# Patient Record
Sex: Female | Born: 1935 | Race: White | Hispanic: No | Marital: Single | Smoking: Former smoker
Health system: Southern US, Community
[De-identification: ages and names within clinical notes are randomized; demographics above are authoritative.]

## PROBLEM LIST (undated history)

## (undated) DIAGNOSIS — K5790 Diverticulosis of intestine, part unspecified, without perforation or abscess without bleeding: Secondary | ICD-10-CM

## (undated) DIAGNOSIS — R1310 Dysphagia, unspecified: Secondary | ICD-10-CM

## (undated) DIAGNOSIS — C801 Malignant (primary) neoplasm, unspecified: Secondary | ICD-10-CM

## (undated) DIAGNOSIS — H409 Unspecified glaucoma: Secondary | ICD-10-CM

## (undated) DIAGNOSIS — J45909 Unspecified asthma, uncomplicated: Secondary | ICD-10-CM

## (undated) DIAGNOSIS — D499 Neoplasm of unspecified behavior of unspecified site: Secondary | ICD-10-CM

## (undated) DIAGNOSIS — F0280 Dementia in other diseases classified elsewhere without behavioral disturbance: Secondary | ICD-10-CM

## (undated) DIAGNOSIS — G4700 Insomnia, unspecified: Secondary | ICD-10-CM

## (undated) DIAGNOSIS — F32A Depression, unspecified: Secondary | ICD-10-CM

## (undated) DIAGNOSIS — J449 Chronic obstructive pulmonary disease, unspecified: Secondary | ICD-10-CM

## (undated) DIAGNOSIS — H5440 Blindness, one eye, unspecified eye: Secondary | ICD-10-CM

## (undated) DIAGNOSIS — F329 Major depressive disorder, single episode, unspecified: Secondary | ICD-10-CM

## (undated) DIAGNOSIS — E559 Vitamin D deficiency, unspecified: Secondary | ICD-10-CM

## (undated) DIAGNOSIS — G309 Alzheimer's disease, unspecified: Secondary | ICD-10-CM

## (undated) DIAGNOSIS — E785 Hyperlipidemia, unspecified: Secondary | ICD-10-CM

## (undated) DIAGNOSIS — I10 Essential (primary) hypertension: Secondary | ICD-10-CM

## (undated) HISTORY — PX: ABDOMINAL HYSTERECTOMY: SHX81

## (undated) HISTORY — PX: CATARACT EXTRACTION: SUR2

## (undated) HISTORY — PX: CHOLECYSTECTOMY: SHX55

## (undated) HISTORY — PX: TOTAL HIP ARTHROPLASTY: SHX124

---

## 1958-12-23 HISTORY — PX: LUNG REMOVAL, PARTIAL: SHX233

## 2019-02-18 ENCOUNTER — Inpatient Hospital Stay (HOSPITAL_COMMUNITY)
Admission: EM | Admit: 2019-02-18 | Discharge: 2019-03-24 | DRG: 190 | Disposition: E | Payer: Medicare HMO | Attending: Internal Medicine | Admitting: Internal Medicine

## 2019-02-18 ENCOUNTER — Emergency Department (HOSPITAL_COMMUNITY): Payer: Medicare HMO

## 2019-02-18 ENCOUNTER — Encounter (HOSPITAL_COMMUNITY): Payer: Self-pay

## 2019-02-18 ENCOUNTER — Other Ambulatory Visit: Payer: Self-pay

## 2019-02-18 DIAGNOSIS — Z681 Body mass index (BMI) 19 or less, adult: Secondary | ICD-10-CM | POA: Diagnosis not present

## 2019-02-18 DIAGNOSIS — E559 Vitamin D deficiency, unspecified: Secondary | ICD-10-CM | POA: Diagnosis present

## 2019-02-18 DIAGNOSIS — G309 Alzheimer's disease, unspecified: Secondary | ICD-10-CM | POA: Diagnosis present

## 2019-02-18 DIAGNOSIS — Z515 Encounter for palliative care: Secondary | ICD-10-CM | POA: Diagnosis present

## 2019-02-18 DIAGNOSIS — Z880 Allergy status to penicillin: Secondary | ICD-10-CM

## 2019-02-18 DIAGNOSIS — R0602 Shortness of breath: Secondary | ICD-10-CM

## 2019-02-18 DIAGNOSIS — T17908A Unspecified foreign body in respiratory tract, part unspecified causing other injury, initial encounter: Secondary | ICD-10-CM | POA: Diagnosis present

## 2019-02-18 DIAGNOSIS — Z882 Allergy status to sulfonamides status: Secondary | ICD-10-CM

## 2019-02-18 DIAGNOSIS — E785 Hyperlipidemia, unspecified: Secondary | ICD-10-CM | POA: Diagnosis present

## 2019-02-18 DIAGNOSIS — I248 Other forms of acute ischemic heart disease: Secondary | ICD-10-CM | POA: Diagnosis present

## 2019-02-18 DIAGNOSIS — E876 Hypokalemia: Secondary | ICD-10-CM | POA: Diagnosis present

## 2019-02-18 DIAGNOSIS — I10 Essential (primary) hypertension: Secondary | ICD-10-CM | POA: Diagnosis present

## 2019-02-18 DIAGNOSIS — D320 Benign neoplasm of cerebral meninges: Secondary | ICD-10-CM | POA: Diagnosis present

## 2019-02-18 DIAGNOSIS — R1314 Dysphagia, pharyngoesophageal phase: Secondary | ICD-10-CM | POA: Diagnosis not present

## 2019-02-18 DIAGNOSIS — Z8249 Family history of ischemic heart disease and other diseases of the circulatory system: Secondary | ICD-10-CM

## 2019-02-18 DIAGNOSIS — Z96642 Presence of left artificial hip joint: Secondary | ICD-10-CM | POA: Diagnosis present

## 2019-02-18 DIAGNOSIS — J690 Pneumonitis due to inhalation of food and vomit: Secondary | ICD-10-CM | POA: Diagnosis present

## 2019-02-18 DIAGNOSIS — Z85828 Personal history of other malignant neoplasm of skin: Secondary | ICD-10-CM

## 2019-02-18 DIAGNOSIS — J9602 Acute respiratory failure with hypercapnia: Secondary | ICD-10-CM | POA: Diagnosis present

## 2019-02-18 DIAGNOSIS — E872 Acidosis: Secondary | ICD-10-CM | POA: Diagnosis not present

## 2019-02-18 DIAGNOSIS — Z7189 Other specified counseling: Secondary | ICD-10-CM

## 2019-02-18 DIAGNOSIS — Z66 Do not resuscitate: Secondary | ICD-10-CM | POA: Diagnosis present

## 2019-02-18 DIAGNOSIS — R0600 Dyspnea, unspecified: Secondary | ICD-10-CM | POA: Diagnosis not present

## 2019-02-18 DIAGNOSIS — Z888 Allergy status to other drugs, medicaments and biological substances status: Secondary | ICD-10-CM

## 2019-02-18 DIAGNOSIS — R7989 Other specified abnormal findings of blood chemistry: Secondary | ICD-10-CM | POA: Diagnosis not present

## 2019-02-18 DIAGNOSIS — J441 Chronic obstructive pulmonary disease with (acute) exacerbation: Secondary | ICD-10-CM | POA: Diagnosis present

## 2019-02-18 DIAGNOSIS — I119 Hypertensive heart disease without heart failure: Secondary | ICD-10-CM | POA: Diagnosis present

## 2019-02-18 DIAGNOSIS — J811 Chronic pulmonary edema: Secondary | ICD-10-CM

## 2019-02-18 DIAGNOSIS — Z87891 Personal history of nicotine dependence: Secondary | ICD-10-CM

## 2019-02-18 DIAGNOSIS — R1310 Dysphagia, unspecified: Secondary | ICD-10-CM | POA: Diagnosis present

## 2019-02-18 DIAGNOSIS — J810 Acute pulmonary edema: Secondary | ICD-10-CM | POA: Diagnosis not present

## 2019-02-18 DIAGNOSIS — I361 Nonrheumatic tricuspid (valve) insufficiency: Secondary | ICD-10-CM | POA: Diagnosis not present

## 2019-02-18 DIAGNOSIS — H409 Unspecified glaucoma: Secondary | ICD-10-CM | POA: Diagnosis present

## 2019-02-18 DIAGNOSIS — F0280 Dementia in other diseases classified elsewhere without behavioral disturbance: Secondary | ICD-10-CM | POA: Diagnosis present

## 2019-02-18 DIAGNOSIS — H5462 Unqualified visual loss, left eye, normal vision right eye: Secondary | ICD-10-CM | POA: Diagnosis present

## 2019-02-18 DIAGNOSIS — E43 Unspecified severe protein-calorie malnutrition: Secondary | ICD-10-CM | POA: Diagnosis present

## 2019-02-18 DIAGNOSIS — R778 Other specified abnormalities of plasma proteins: Secondary | ICD-10-CM | POA: Diagnosis present

## 2019-02-18 DIAGNOSIS — T17908D Unspecified foreign body in respiratory tract, part unspecified causing other injury, subsequent encounter: Secondary | ICD-10-CM | POA: Diagnosis not present

## 2019-02-18 DIAGNOSIS — R4702 Dysphasia: Secondary | ICD-10-CM | POA: Diagnosis present

## 2019-02-18 DIAGNOSIS — F329 Major depressive disorder, single episode, unspecified: Secondary | ICD-10-CM | POA: Diagnosis present

## 2019-02-18 DIAGNOSIS — G4700 Insomnia, unspecified: Secondary | ICD-10-CM | POA: Diagnosis present

## 2019-02-18 DIAGNOSIS — J9601 Acute respiratory failure with hypoxia: Secondary | ICD-10-CM | POA: Diagnosis present

## 2019-02-18 DIAGNOSIS — R0989 Other specified symptoms and signs involving the circulatory and respiratory systems: Secondary | ICD-10-CM | POA: Diagnosis not present

## 2019-02-18 DIAGNOSIS — I340 Nonrheumatic mitral (valve) insufficiency: Secondary | ICD-10-CM | POA: Diagnosis not present

## 2019-02-18 DIAGNOSIS — Z79899 Other long term (current) drug therapy: Secondary | ICD-10-CM

## 2019-02-18 HISTORY — DX: Essential (primary) hypertension: I10

## 2019-02-18 HISTORY — DX: Vitamin D deficiency, unspecified: E55.9

## 2019-02-18 HISTORY — DX: Insomnia, unspecified: G47.00

## 2019-02-18 HISTORY — DX: Diverticulosis of intestine, part unspecified, without perforation or abscess without bleeding: K57.90

## 2019-02-18 HISTORY — DX: Unspecified asthma, uncomplicated: J45.909

## 2019-02-18 HISTORY — DX: Blindness, one eye, unspecified eye: H54.40

## 2019-02-18 HISTORY — DX: Unspecified glaucoma: H40.9

## 2019-02-18 HISTORY — DX: Depression, unspecified: F32.A

## 2019-02-18 HISTORY — DX: Chronic obstructive pulmonary disease, unspecified: J44.9

## 2019-02-18 HISTORY — DX: Malignant (primary) neoplasm, unspecified: C80.1

## 2019-02-18 HISTORY — DX: Major depressive disorder, single episode, unspecified: F32.9

## 2019-02-18 HISTORY — DX: Alzheimer's disease, unspecified: G30.9

## 2019-02-18 HISTORY — DX: Neoplasm of unspecified behavior of unspecified site: D49.9

## 2019-02-18 HISTORY — DX: Dementia in other diseases classified elsewhere, unspecified severity, without behavioral disturbance, psychotic disturbance, mood disturbance, and anxiety: F02.80

## 2019-02-18 HISTORY — DX: Hyperlipidemia, unspecified: E78.5

## 2019-02-18 HISTORY — DX: Dysphagia, unspecified: R13.10

## 2019-02-18 LAB — CBC WITH DIFFERENTIAL/PLATELET
Abs Immature Granulocytes: 0.03 10*3/uL (ref 0.00–0.07)
Basophils Absolute: 0 10*3/uL (ref 0.0–0.1)
Basophils Relative: 0 %
Eosinophils Absolute: 0.2 10*3/uL (ref 0.0–0.5)
Eosinophils Relative: 2 %
HCT: 54 % — ABNORMAL HIGH (ref 36.0–46.0)
Hemoglobin: 16.3 g/dL — ABNORMAL HIGH (ref 12.0–15.0)
IMMATURE GRANULOCYTES: 0 %
Lymphocytes Relative: 22 %
Lymphs Abs: 2.3 10*3/uL (ref 0.7–4.0)
MCH: 28 pg (ref 26.0–34.0)
MCHC: 30.2 g/dL (ref 30.0–36.0)
MCV: 92.6 fL (ref 80.0–100.0)
MONOS PCT: 5 %
Monocytes Absolute: 0.5 10*3/uL (ref 0.1–1.0)
Neutro Abs: 7.5 10*3/uL (ref 1.7–7.7)
Neutrophils Relative %: 71 %
Platelets: 249 10*3/uL (ref 150–400)
RBC: 5.83 MIL/uL — ABNORMAL HIGH (ref 3.87–5.11)
RDW: 12.5 % (ref 11.5–15.5)
WBC: 10.6 10*3/uL — ABNORMAL HIGH (ref 4.0–10.5)
nRBC: 0 % (ref 0.0–0.2)

## 2019-02-18 LAB — COMPREHENSIVE METABOLIC PANEL
ALT: 14 U/L (ref 0–44)
AST: 24 U/L (ref 15–41)
Albumin: 4.1 g/dL (ref 3.5–5.0)
Alkaline Phosphatase: 71 U/L (ref 38–126)
Anion gap: 11 (ref 5–15)
BUN: 7 mg/dL — AB (ref 8–23)
CO2: 31 mmol/L (ref 22–32)
Calcium: 8.8 mg/dL — ABNORMAL LOW (ref 8.9–10.3)
Chloride: 94 mmol/L — ABNORMAL LOW (ref 98–111)
Creatinine, Ser: 0.65 mg/dL (ref 0.44–1.00)
GFR calc Af Amer: >60 mL/min (ref >60)
GFR calc non Af Amer: >60 mL/min (ref >60)
Glucose, Bld: 186 mg/dL — ABNORMAL HIGH (ref 70–99)
Potassium: 2.6 mmol/L — CL (ref 3.5–5.1)
Sodium: 136 mmol/L (ref 135–145)
Total Bilirubin: 0.4 mg/dL (ref 0.3–1.2)
Total Protein: 7.4 g/dL (ref 6.5–8.1)

## 2019-02-18 LAB — TROPONIN I: Troponin I: 0.09 ng/mL (ref <0.03)

## 2019-02-18 MED ORDER — POTASSIUM CHLORIDE 10 MEQ/100ML IV SOLN
10.0000 meq | Freq: Once | INTRAVENOUS | Status: AC
  Administered 2019-02-19: 10 meq via INTRAVENOUS
  Filled 2019-02-18: qty 100

## 2019-02-18 MED ORDER — IPRATROPIUM-ALBUTEROL 0.5-2.5 (3) MG/3ML IN SOLN
3.0000 mL | Freq: Once | RESPIRATORY_TRACT | Status: AC
  Administered 2019-02-18: 3 mL via RESPIRATORY_TRACT
  Filled 2019-02-18: qty 3

## 2019-02-18 MED ORDER — IPRATROPIUM-ALBUTEROL 0.5-2.5 (3) MG/3ML IN SOLN: 3.0000 mL | Freq: Once | RESPIRATORY_TRACT | Status: DC

## 2019-02-18 MED ORDER — METHYLPREDNISOLONE SODIUM SUCC 125 MG IJ SOLR
125.0000 mg | Freq: Once | INTRAMUSCULAR | Status: AC
  Administered 2019-02-18: 125 mg via INTRAVENOUS
  Filled 2019-02-18: qty 2

## 2019-02-18 MED ORDER — ALBUTEROL SULFATE (2.5 MG/3ML) 0.083% IN NEBU
2.5000 mg | INHALATION_SOLUTION | Freq: Once | RESPIRATORY_TRACT | Status: AC
  Administered 2019-02-18: 2.5 mg via RESPIRATORY_TRACT
  Filled 2019-02-18: qty 3

## 2019-02-18 MED ORDER — POTASSIUM CHLORIDE 10 MEQ/100ML IV SOLN
10.0000 meq | Freq: Once | INTRAVENOUS | Status: AC
  Administered 2019-02-18: 10 meq via INTRAVENOUS
  Filled 2019-02-18: qty 100

## 2019-02-18 MED ORDER — MAGNESIUM SULFATE 2 GM/50ML IV SOLN
2.0000 g | Freq: Once | INTRAVENOUS | Status: AC
  Administered 2019-02-18: 2 g via INTRAVENOUS
  Filled 2019-02-18: qty 50

## 2019-02-18 NOTE — ED Notes (Signed)
Date and time results received: 02/04/2019 2320 (use smartphrase ".now" to insert current time)  Test: potassium Critical Value: 2.6  Name of Provider Notified: Dr. Roderic Palau  Orders Received? Or Actions Taken?: Actions Taken: no orders received

## 2019-02-18 NOTE — ED Provider Notes (Signed)
[facility] EMERGENCY DEPARTMENT Provider Note   CSN: [id number] Arrival date & time: 02/07/2019  2125    History   Chief Complaint Chief Complaint  Patient presents with  . Respiratory Distress    HPI Sheila Flores is a 85 y.o. female.     Patient has a history of COPD.  Patient was eating spaghetti and got choked and became short of breath.   Shortness of Breath  Severity:  Severe Onset quality:  Sudden Timing:  Constant Progression:  Resolved Chronicity:  New Context: not activity   Relieved by:  Nothing Worsened by:  Nothing Ineffective treatments:  None tried Associated symptoms: no abdominal pain, no chest pain, no cough, no headaches and no rash     Past Medical History:  Diagnosis Date  . Alzheimer's dementia ([place])   . Asthma   . Blindness of left eye   . Cancer ([place])    basal cell carncinoma  . COPD (chronic obstructive pulmonary disease) ([place])   . Depression   . Diverticulosis   . Dysphagia   . Glaucoma   . Hyperlipidemia   . Hypertension   . Insomnia   . Vitamin D deficiency     Patient Active Problem List   Diagnosis Date Noted  . COPD exacerbation ([place]) 01/30/2019    Past Surgical History:  Procedure Laterality Date  . ABDOMINAL HYSTERECTOMY    . CATARACT EXTRACTION    . CHOLECYSTECTOMY    . LUNG REMOVAL, PARTIAL Left 1960  . TOTAL HIP ARTHROPLASTY Left      OB History   No obstetric history on file.      Home Medications    Prior to Admission medications   Medication Sig Start Date End Date Taking? Authorizing Provider  albuterol (PROVENTIL) (2.5 MG/3ML) 0.083% nebulizer solution Take 1 ampule by nebulization 3 (three) times daily as needed for wheezing or shortness of breath.  09/10/18  Yes [provider]  amLODipine (NORVASC) 2.5 MG tablet Take 2.5 mg by mouth every evening.  03/25/18  Yes [provider]  atorvastatin (LIPITOR) 10 MG tablet Take 10 mg by mouth every evening.  03/25/18  Yes [provider]  montelukast (SINGULAIR) 10 MG tablet Take 10 mg by mouth every evening.  02/08/19  Yes [provider]  sertraline (ZOLOFT) 25 MG tablet Take 25 mg by mouth every evening.  12/25/18  Yes [provider]    Family History Family History  Problem Relation Age of Onset  . Cancer Mother   . Heart failure Mother   . Heart failure Father     Social History Social History   Tobacco Use  . Smoking status: Former Research scientist (life sciences)  . Smokeless tobacco: Never Used  Substance Use Topics  . Alcohol use: Never    Frequency: Never  . Drug use: Never     Allergies   Cephalexin; Clarithromycin; Penicillins; and Sulfa antibiotics   Review of Systems Review of Systems  Constitutional: Negative for appetite change and fatigue.  HENT: Negative for congestion, ear discharge and sinus pressure.   Eyes: Negative for discharge.  Respiratory: Positive for shortness of breath. Negative for cough.   Cardiovascular: Negative for chest pain.  Gastrointestinal: Negative for abdominal pain and diarrhea.  Genitourinary: Negative for frequency and hematuria.  Musculoskeletal: Negative for back pain.  Skin: Negative for rash.  Neurological: Negative for seizures and headaches.  Psychiatric/Behavioral: Negative for hallucinations.     Physical Exam Updated Vital Signs Ht 5\' 2"  (1.575 m)  Wt 55 kg   LMP  (Exact Date)   SpO2 94%   BMI 22.18 kg/m   Physical Exam Vitals signs and nursing note reviewed.  Constitutional:      Appearance: She is well-developed.  HENT:     Head: Normocephalic.     Nose: Nose normal.  Eyes:     General: No scleral icterus.    Conjunctiva/sclera: Conjunctivae normal.  Neck:     Musculoskeletal: Neck supple.     Thyroid: No thyromegaly.  Cardiovascular:     Rate and Rhythm: Normal rate and regular rhythm.     Heart sounds: No murmur. No friction rub. No gallop.   Pulmonary:     Breath sounds: No stridor. Wheezing present. No rales.    Chest:     Chest wall: No tenderness.  Abdominal:     General: There is no distension.     Tenderness: There is no abdominal tenderness. There is no rebound.  Musculoskeletal: Normal range of motion.  Lymphadenopathy:     Cervical: No cervical adenopathy.  Skin:    Findings: No erythema or rash.  Neurological:     Mental Status: She is oriented to person, place, and time.     Motor: No abnormal muscle tone.     Coordination: Coordination normal.  Psychiatric:        Behavior: Behavior normal.      ED Treatments / Results  Labs (all labs ordered are listed, but only abnormal results are displayed) Labs Reviewed  CBC WITH DIFFERENTIAL/PLATELET - Abnormal; Notable for the following components:      Result Value   WBC 10.6 (*)    RBC 5.83 (*)    Hemoglobin 16.3 (*)    HCT 54.0 (*)    All other components within normal limits  COMPREHENSIVE METABOLIC PANEL - Abnormal; Notable for the following components:   Potassium 2.6 (*)    Chloride 94 (*)    Glucose, Bld 186 (*)    BUN 7 (*)    Calcium 8.8 (*)    All other components within normal limits  TROPONIN I - Abnormal; Notable for the following components:   Troponin I 0.09 (*)    All other components within normal limits    EKG EKG Interpretation  Date/Time:  Thursday February 18 2019 21:30:49 EST Ventricular Rate:  120 PR Interval:    QRS Duration: 94 QT Interval:  327 QTC Calculation: 461 R Axis:   77 Text Interpretation:  Sinus tachycardia Probable anteroseptal infarct, old Minimal ST depression Confirmed by Milton Ferguson [phone]) on 02/17/2019 10:39:45 PM Also confirmed by Milton Ferguson [phone])  on 02/13/2019 11:23:13 PM   Radiology Dg Chest Portable 1 View  Result Date: 01/29/2019 CLINICAL DATA:  Shortness of breath EXAM: PORTABLE CHEST 1 VIEW COMPARISON:  None. FINDINGS: There is hyperinflation of the lungs compatible with COPD. Mild cardiomegaly. No confluent airspace opacities, effusions or edema. No acute  bony abnormality. IMPRESSION: COPD, cardiomegaly.  No active disease. Electronically Signed   By: Rolm Baptise M.D.   On: 01/26/2019 21:56    Procedures Procedures (including critical care time)  Medications Ordered in ED Medications  ipratropium-albuterol (DUONEB) 0.5-2.5 (3) MG/3ML nebulizer solution 3 mL (has no administration in time range)  potassium chloride 10 mEq in 100 mL IVPB (has no administration in time range)  ipratropium-albuterol (DUONEB) 0.5-2.5 (3) MG/3ML nebulizer solution 3 mL (3 mLs Nebulization Given 02/02/2019 2146)  albuterol (PROVENTIL) (2.5 MG/3ML) 0.083% nebulizer solution 2.5 mg (2.5 mg  Nebulization Given 01/29/2019 2146)  magnesium sulfate IVPB 2 g 50 mL (0 g Intravenous Stopped 02/16/2019 2301)  methylPREDNISolone sodium succinate (SOLU-MEDROL) 125 mg/2 mL injection 125 mg (125 mg Intravenous Given 02/07/2019 2148)     Initial Impression / Assessment and Plan / ED Course  I have reviewed the triage vital signs and the nursing notes.  Pertinent labs & imaging results that were available during my care of the patient were reviewed by me and considered in my medical decision making (see chart for details).    CRITICAL CARE Performed by: Milton Ferguson Total critical care time:40 minutes Critical care time was exclusive of separately billable procedures and treating other patients. Critical care was necessary to treat or prevent imminent or life-threatening deterioration. Critical care was time spent personally by me on the following activities: development of treatment plan with patient and/or surrogate as well as nursing, discussions with consultants, evaluation of patient's response to treatment, examination of patient, obtaining history from patient or surrogate, ordering and performing treatments and interventions, ordering and review of laboratory studies, ordering and review of radiographic studies, pulse oximetry and re-evaluation of patient's condition.    Patient  with choking episode and wheezing.  She has received neb treatments as helped some but she remains slightly hypoxic and needs 2 L of oxygen.  Patient may have aspirated.  Patient is also hypokalemic and has elevated troponin.  She will be admitted to medicine   Final Clinical Impressions(s) / ED Diagnoses   Final diagnoses:  COPD exacerbation [facility])    ED Discharge Orders    None       Milton Ferguson, MD 02/04/2019 2338

## 2019-02-18 NOTE — ED Triage Notes (Signed)
Pt in by [facility] after choking after eating spaghetti.  Per ems, pt had cyanosis to her lips, sats in the 70's, placed on 15 liters nrb per ems with some improvement.

## 2019-02-18 NOTE — ED Notes (Signed)
X-Ray at bedside.

## 2019-02-18 NOTE — ED Notes (Signed)
Date and time results received: 01/24/2019 2320 (use smartphrase ".now" to insert current time)  Test: troponin Critical Value: 0.09  Name of Provider Notified: Dr Roderic Palau  Orders Received? Or Actions Taken?: Actions Taken: no orders received

## 2019-02-19 ENCOUNTER — Inpatient Hospital Stay (HOSPITAL_COMMUNITY): Payer: Medicare HMO

## 2019-02-19 ENCOUNTER — Other Ambulatory Visit: Payer: Self-pay

## 2019-02-19 ENCOUNTER — Encounter (HOSPITAL_COMMUNITY): Payer: Self-pay | Admitting: *Deleted

## 2019-02-19 DIAGNOSIS — I361 Nonrheumatic tricuspid (valve) insufficiency: Secondary | ICD-10-CM

## 2019-02-19 DIAGNOSIS — T17908A Unspecified foreign body in respiratory tract, part unspecified causing other injury, initial encounter: Secondary | ICD-10-CM | POA: Diagnosis present

## 2019-02-19 DIAGNOSIS — I248 Other forms of acute ischemic heart disease: Secondary | ICD-10-CM

## 2019-02-19 DIAGNOSIS — I10 Essential (primary) hypertension: Secondary | ICD-10-CM

## 2019-02-19 DIAGNOSIS — E876 Hypokalemia: Secondary | ICD-10-CM

## 2019-02-19 DIAGNOSIS — I340 Nonrheumatic mitral (valve) insufficiency: Secondary | ICD-10-CM

## 2019-02-19 DIAGNOSIS — J441 Chronic obstructive pulmonary disease with (acute) exacerbation: Principal | ICD-10-CM

## 2019-02-19 DIAGNOSIS — R7989 Other specified abnormal findings of blood chemistry: Secondary | ICD-10-CM

## 2019-02-19 DIAGNOSIS — R778 Other specified abnormalities of plasma proteins: Secondary | ICD-10-CM | POA: Diagnosis present

## 2019-02-19 DIAGNOSIS — F0280 Dementia in other diseases classified elsewhere without behavioral disturbance: Secondary | ICD-10-CM

## 2019-02-19 DIAGNOSIS — R1310 Dysphagia, unspecified: Secondary | ICD-10-CM

## 2019-02-19 DIAGNOSIS — R0989 Other specified symptoms and signs involving the circulatory and respiratory systems: Secondary | ICD-10-CM

## 2019-02-19 DIAGNOSIS — G309 Alzheimer's disease, unspecified: Secondary | ICD-10-CM

## 2019-02-19 LAB — CBC
HCT: 48.5 % — ABNORMAL HIGH (ref 36.0–46.0)
Hemoglobin: 14.9 g/dL (ref 12.0–15.0)
MCH: 28.5 pg (ref 26.0–34.0)
MCHC: 30.7 g/dL (ref 30.0–36.0)
MCV: 92.7 fL (ref 80.0–100.0)
Platelets: 239 10*3/uL (ref 150–400)
RBC: 5.23 MIL/uL — ABNORMAL HIGH (ref 3.87–5.11)
RDW: 12.5 % (ref 11.5–15.5)
WBC: 15.5 10*3/uL — ABNORMAL HIGH (ref 4.0–10.5)
nRBC: 0 % (ref 0.0–0.2)

## 2019-02-19 LAB — ECHOCARDIOGRAM COMPLETE
Height: 62 in
Weight: 1940.05 oz

## 2019-02-19 LAB — BASIC METABOLIC PANEL
Anion gap: 11 (ref 5–15)
BUN: 9 mg/dL (ref 8–23)
CALCIUM: 8.3 mg/dL — AB (ref 8.9–10.3)
CO2: 28 mmol/L (ref 22–32)
Chloride: 99 mmol/L (ref 98–111)
Creatinine, Ser: 0.7 mg/dL (ref 0.44–1.00)
GFR calc Af Amer: >60 mL/min (ref >60)
Glucose, Bld: 188 mg/dL — ABNORMAL HIGH (ref 70–99)
Potassium: 3.2 mmol/L — ABNORMAL LOW (ref 3.5–5.1)
SODIUM: 138 mmol/L (ref 135–145)

## 2019-02-19 LAB — CBG MONITORING, ED: Glucose-Capillary: 150 mg/dL — ABNORMAL HIGH (ref 70–99)

## 2019-02-19 LAB — TROPONIN I
TROPONIN I: 1.13 ng/mL — AB (ref <0.03)
Troponin I: 2.08 ng/mL (ref <0.03)
Troponin I: 2.24 ng/mL (ref <0.03)

## 2019-02-19 LAB — HEPARIN LEVEL (UNFRACTIONATED): Heparin Unfractionated: 0.86 IU/mL — ABNORMAL HIGH (ref 0.30–0.70)

## 2019-02-19 MED ORDER — HEPARIN BOLUS VIA INFUSION
3300.0000 [IU] | Freq: Once | INTRAVENOUS | Status: AC
  Administered 2019-02-19: 3300 [IU] via INTRAVENOUS

## 2019-02-19 MED ORDER — ALBUTEROL SULFATE (2.5 MG/3ML) 0.083% IN NEBU
2.5000 mg | INHALATION_SOLUTION | Freq: Three times a day (TID) | RESPIRATORY_TRACT | Status: DC | PRN
  Administered 2019-02-20 – 2019-02-21 (×2): 2.5 mg via RESPIRATORY_TRACT
  Filled 2019-02-19 (×2): qty 3

## 2019-02-19 MED ORDER — PIPERACILLIN-TAZOBACTAM 3.375 G IVPB
3.3750 g | Freq: Once | INTRAVENOUS | Status: AC
  Administered 2019-02-19: 3.375 g via INTRAVENOUS
  Filled 2019-02-19: qty 50

## 2019-02-19 MED ORDER — HEPARIN (PORCINE) 25000 UT/250ML-% IV SOLN
650.0000 [IU]/h | INTRAVENOUS | Status: DC
  Administered 2019-02-19: 650 [IU]/h via INTRAVENOUS
  Administered 2019-02-21: 500 [IU]/h via INTRAVENOUS
  Administered 2019-02-22: 650 [IU]/h via INTRAVENOUS
  Filled 2019-02-19 (×2): qty 250

## 2019-02-19 MED ORDER — METHYLPREDNISOLONE SODIUM SUCC 125 MG IJ SOLR
60.0000 mg | Freq: Two times a day (BID) | INTRAMUSCULAR | Status: DC
  Administered 2019-02-19 – 2019-02-21 (×5): 60 mg via INTRAVENOUS
  Filled 2019-02-19 (×5): qty 2

## 2019-02-19 MED ORDER — SODIUM CHLORIDE 0.9 % IV SOLN
250.0000 mL | INTRAVENOUS | Status: DC | PRN
  Administered 2019-02-25: 250 mL via INTRAVENOUS

## 2019-02-19 MED ORDER — IPRATROPIUM-ALBUTEROL 0.5-2.5 (3) MG/3ML IN SOLN
3.0000 mL | Freq: Four times a day (QID) | RESPIRATORY_TRACT | Status: DC
  Administered 2019-02-19 – 2019-02-26 (×30): 3 mL via RESPIRATORY_TRACT
  Filled 2019-02-19 (×31): qty 3

## 2019-02-19 MED ORDER — POTASSIUM CHLORIDE CRYS ER 20 MEQ PO TBCR
20.0000 meq | EXTENDED_RELEASE_TABLET | Freq: Two times a day (BID) | ORAL | Status: DC
  Administered 2019-02-19: 20 meq via ORAL
  Filled 2019-02-19: qty 1

## 2019-02-19 MED ORDER — PIPERACILLIN-TAZOBACTAM 3.375 G IVPB
3.3750 g | Freq: Three times a day (TID) | INTRAVENOUS | Status: AC
  Administered 2019-02-19 – 2019-02-25 (×20): 3.375 g via INTRAVENOUS
  Filled 2019-02-19 (×20): qty 50

## 2019-02-19 MED ORDER — MONTELUKAST SODIUM 10 MG PO TABS: 10.0000 mg | ORAL_TABLET | Freq: Every evening | ORAL | Status: DC

## 2019-02-19 MED ORDER — HEPARIN (PORCINE) 25000 UT/250ML-% IV SOLN
INTRAVENOUS | Status: AC
  Filled 2019-02-19: qty 250

## 2019-02-19 MED ORDER — SERTRALINE HCL 50 MG PO TABS: 25.0000 mg | ORAL_TABLET | Freq: Every evening | ORAL | Status: DC

## 2019-02-19 MED ORDER — ASPIRIN 81 MG PO CHEW
81.0000 mg | CHEWABLE_TABLET | Freq: Every day | ORAL | Status: DC
  Administered 2019-02-22 – 2019-02-24 (×3): 81 mg via ORAL
  Filled 2019-02-19 (×5): qty 1

## 2019-02-19 MED ORDER — SODIUM CHLORIDE 0.9 % IV SOLN
INTRAVENOUS | Status: DC
  Administered 2019-02-19 – 2019-02-23 (×3): via INTRAVENOUS

## 2019-02-19 MED ORDER — HALOPERIDOL LACTATE 5 MG/ML IJ SOLN
5.0000 mg | Freq: Once | INTRAMUSCULAR | Status: AC
  Administered 2019-02-19: 5 mg via INTRAVENOUS
  Filled 2019-02-19: qty 1

## 2019-02-19 MED ORDER — SODIUM CHLORIDE 0.9% FLUSH: 3.0000 mL | INTRAVENOUS | Status: DC | PRN

## 2019-02-19 MED ORDER — SODIUM CHLORIDE 0.9% FLUSH
3.0000 mL | Freq: Two times a day (BID) | INTRAVENOUS | Status: DC
  Administered 2019-02-19 – 2019-02-28 (×11): 3 mL via INTRAVENOUS

## 2019-02-19 MED ORDER — AMLODIPINE BESYLATE 5 MG PO TABS: 2.5000 mg | ORAL_TABLET | Freq: Every evening | ORAL | Status: DC

## 2019-02-19 MED ORDER — ATORVASTATIN CALCIUM 10 MG PO TABS: 10.0000 mg | ORAL_TABLET | Freq: Every evening | ORAL | Status: DC

## 2019-02-19 MED ORDER — IPRATROPIUM BROMIDE 0.02 % IN SOLN: 0.5000 mg | Freq: Four times a day (QID) | RESPIRATORY_TRACT | Status: DC

## 2019-02-19 MED ORDER — PREDNISONE 20 MG PO TABS: 40.0000 mg | ORAL_TABLET | Freq: Two times a day (BID) | ORAL | Status: DC

## 2019-02-19 MED ORDER — ASPIRIN 81 MG PO CHEW
324.0000 mg | CHEWABLE_TABLET | Freq: Once | ORAL | Status: AC
  Administered 2019-02-19: 324 mg via ORAL
  Filled 2019-02-19: qty 4

## 2019-02-19 MED ORDER — ALBUTEROL SULFATE (2.5 MG/3ML) 0.083% IN NEBU: 2.5000 mg | INHALATION_SOLUTION | RESPIRATORY_TRACT | Status: DC | PRN

## 2019-02-19 MED ORDER — ALBUTEROL SULFATE (2.5 MG/3ML) 0.083% IN NEBU: 2.5000 mg | INHALATION_SOLUTION | Freq: Four times a day (QID) | RESPIRATORY_TRACT | Status: DC

## 2019-02-19 NOTE — Progress Notes (Signed)
Pharmacy Antibiotic Note  Sheila Flores is a 85 y.o. female admitted on 01/24/2019 with possible aspiration pneumonia.  Pharmacy has been consulted for Zosyn dosing. Patient tolerated first dose of Zosyn, despite listed cephalexin and PCN allergies.  Plan: Start Zosyn 3.375g IV q8h (ext infusion)  Pharmacy will continue to monitor renal function,  cultures and patient progress.   Height: 5\' 2"  (157.5 cm) Weight: 121 lb 4.1 oz (55 kg) IBW/kg (Calculated) : 50.1  No data recorded.  Recent Labs  Lab 02/19/2019 2132 02/06/2019 2225 02/19/19 0703  WBC 10.6*  --  15.5*  CREATININE  --  0.65 0.70    Estimated Creatinine Clearance: 39.9 mL/min (by C-G formula based on SCr of 0.7 mg/dL).    Allergies  Allergen Reactions  . Cephalexin     Unknown reaction  . Clarithromycin     Unknown reaction  . Penicillins     Did it involve swelling of the face/tongue/throat, SOB, or low BP? Unknown Did it involve sudden or severe rash/hives, skin peeling, or any reaction on the inside of your mouth or nose? Unknown Did you need to seek medical attention at a hospital or doctor's office? Unknownu  When did it last happen? If all above answers are "NO", may proceed with cephalosporin use.    . Sulfa Antibiotics     Unknown reaction    Antimicrobials this admission: Zosyn 2/28 >>     Microbiology results: none ordered at this time     Thank you for allowing pharmacy to be a part of this patient's care.  Despina Pole 02/19/2019 8:11 AM

## 2019-02-19 NOTE — Evaluation (Signed)
Modified Barium Swallow Progress Note  Patient Details  Name: Sheila Flores MRN: [id number] Date of Birth: 01/18/1930  Today's Date: 02/19/2019  Modified Barium Swallow completed.  Full report located under Chart Review in the Imaging Section.  Brief recommendations include the following:  Clinical Impression  Pt presents with mild pharyngeal dysphagia and suspected esophageal dysphagia; mild pharyngeal residue noted with all trials that was cleared with additional dry swallows (residue is likely age related change/normal difference), and towards the end of the study note penetration of thin liquids to the level of the cords that was noted to sit on the cords after swallowing- sensation was volume dependent. Pt does have a strong reflexive cough that was effective in clearing penetrates when cued to cough or with reflexive cough; one episode of trace aspiration of thin liquids was visualized and cleared by reflexive cough. Brief stasis of the barium tablet was noted in the pyriforms with Pt reporting "that didn't go down" and continuing to talk while pill sat in the pharyngeal space; Pill passed through the pharyngeal space with additional sips of thin liquids. Esophageal sweep reveals stasis of tablet in the medial/distal esophagus and to and fro movement of liquids in the esophagus however no retrograde movement was noted to or above the level of the cricopharyngus, however, no radiologist present to confirm esophageal observations. With multiple consecutive sips of thin liquids, bites of puree and sips of NTL, barium tablet was not visualized passing through the esophagus. Unfortunately, despite Pt being pressed by heavy trials during this test she did not demonstrate a "choking episode" as she reportedly has frequently. Question if these episodes are related to possible esophageal component of swallowing. Recommend initiate D3/mech soft diet and thin liquids; Recommend meds to be adminsitered  crushed with puree. Recommend precautions with PO: small bites/sips, alternate bites and sips, esophageal precautions including: small meals, when experiencing globus sensation Pt should do multiple dry swallows and take small bites of puree to facilitate clearance. Recommend consider GI consult. ST will continue to follow for diet tolerance and compliance with/implementation of compensatory strategies.    Swallow Evaluation Recommendations   Recommended Consults: Consider GI evaluation   SLP Diet Recommendations: Dysphagia 3 (Mech soft) solids;Thin liquid   Liquid Administration via: Cup;Straw   Medication Administration: Crushed with puree   Supervision: Patient able to self feed;Intermittent supervision to cue for compensatory strategies   Compensations: Minimize environmental distractions;Small sips/bites;Follow solids with liquid;Multiple dry swallows after each bite/sip;Slow rate   Postural Changes: Remain semi-upright after after feeds/meals (Comment);Seated upright at 90 degrees   Oral Care Recommendations: Oral care BID        [name] 02/19/2019,2:05 PM

## 2019-02-19 NOTE — Progress Notes (Signed)
ANTICOAGULATION CONSULT NOTE -Follow Up  Pharmacy Consult for  Heparin dosing Indication:  ACS/STEMI  Allergies  Allergen Reactions  . Cephalexin     Unknown reaction  . Clarithromycin     Unknown reaction  . Penicillins     Did it involve swelling of the face/tongue/throat, SOB, or low BP? Unknown Did it involve sudden or severe rash/hives, skin peeling, or any reaction on the inside of your mouth or nose? Unknown Did you need to seek medical attention at a hospital or doctor's office? Unknownu  When did it last happen? If all above answers are "NO", may proceed with cephalosporin use.    . Sulfa Antibiotics     Unknown reaction    Patient Measurements: Height: 5\' 2"  (157.5 cm) Weight: 106 lb 7.7 oz (48.3 kg) IBW/kg (Calculated) : 50.1 Heparin Dosing Weight: HEPARIN DW (KG): 48.3  Vital Signs: Temp: 98.7 F (37.1 C) (02/28 1609) Temp Source: Oral (02/28 1609) BP: 139/58 (02/28 1555) Pulse Rate: 90 (02/28 1609)  Labs: Recent Labs    02/03/2019 2132  02/19/2019 2225 02/19/19 0030 02/19/19 0703 02/19/19 1234 02/19/19 1643  HGB 16.3*  --   --   --  14.9  --   --   HCT 54.0*  --   --   --  48.5*  --   --   PLT 249  --   --   --  239  --   --   HEPARINUNFRC  --   --   --   --   --   --  0.86*  CREATININE  --   --  0.65  --  0.70  --   --   TROPONINI  --    < > 0.09* 1.13* 2.08* 2.24*  --    < > = values in this interval not displayed.    Estimated Creatinine Clearance: 38.5 mL/min (by C-G formula based on SCr of 0.7 mg/dL).   Medical History: Past Medical History:  Diagnosis Date  . Alzheimer's dementia ([place])   . Asthma   . Blindness of left eye   . Cancer ([place])    basal cell carncinoma  . COPD (chronic obstructive pulmonary disease) ([place])   . Depression   . Diverticulosis   . Dysphagia   . Glaucoma   . Hyperlipidemia   . Hypertension   . Insomnia   . Vitamin D deficiency      Assessment: Pharmacy consulted to dose heparin  for this 85 yo  female  with elevated  troponin I of 2.08 ng/mL.  She hasn't been on any prior anti-coagulants.  1700 PM UPDATE:  Heparin level is 0.86 IU/mL, so will decrease heparin per protocol.  Goal of Therapy:  Heparin level 0.3-0.7 units/ml Monitor platelets by anticoagulation protocol: Yes   Plan:   Decrease heparin infusion to 550 units/hr RE-check heparin level in ~8 hours and daily while on heparin Continue to monitor H&H and platelets  Despina Pole 02/19/2019,5:21 PM

## 2019-02-19 NOTE — Progress Notes (Signed)
ANTICOAGULATION CONSULT NOTE - Initial Consult  Pharmacy Consult for  Heparin dosing Indication:  ACS/STEMI  Allergies  Allergen Reactions  . Cephalexin     Unknown reaction  . Clarithromycin     Unknown reaction  . Penicillins     Did it involve swelling of the face/tongue/throat, SOB, or low BP? Unknown Did it involve sudden or severe rash/hives, skin peeling, or any reaction on the inside of your mouth or nose? Unknown Did you need to seek medical attention at a hospital or doctor's office? Unknownu  When did it last happen? If all above answers are "NO", may proceed with cephalosporin use.    . Sulfa Antibiotics     Unknown reaction    Patient Measurements: Height: 5\' 2"  (157.5 cm) Weight: 121 lb 4.1 oz (55 kg) IBW/kg (Calculated) : 50.1 Heparin Dosing Weight: HEPARIN DW (KG): 55  Vital Signs: BP: 129/53 (02/28 0730) Pulse Rate: 80 (02/28 0730)  Labs: Recent Labs    01/24/2019 2132 02/14/2019 2225 02/19/19 0030 02/19/19 0703  HGB 16.3*  --   --  14.9  HCT 54.0*  --   --  48.5*  PLT 249  --   --  239  CREATININE  --  0.65  --  0.70  TROPONINI  --  0.09* 1.13* 2.08*    Estimated Creatinine Clearance: 39.9 mL/min (by C-G formula based on SCr of 0.7 mg/dL).   Medical History: Past Medical History:  Diagnosis Date  . Alzheimer's dementia ([place])   . Asthma   . Blindness of left eye   . Cancer ([place])    basal cell carncinoma  . COPD (chronic obstructive pulmonary disease) ([place])   . Depression   . Diverticulosis   . Dysphagia   . Glaucoma   . Hyperlipidemia   . Hypertension   . Insomnia   . Vitamin D deficiency      Assessment: Pharmacy consulted to dose heparin  for this 85 yo female  with elevated  troponin I of 2.08 ng/mL.  She hasn't been on any prior anti-coagulants.  Goal of Therapy:  Heparin level 0.3-0.7 units/ml Monitor platelets by anticoagulation protocol: Yes   Plan:  Give 3300 units bolus x 1 Start heparin infusion at 660  units/hr Check anti-Xa level in 6-8 hours and daily while on heparin Continue to monitor H&H and platelets  Despina Pole 02/19/2019,9:00 AM

## 2019-02-19 NOTE — Progress Notes (Signed)
ANTIBIOTIC CONSULT NOTE-Preliminary  Pharmacy Consult for zosyn Indication: pneumonia  Allergies  Allergen Reactions  . Cephalexin     Unknown reaction  . Clarithromycin     Unknown reaction  . Penicillins     Did it involve swelling of the face/tongue/throat, SOB, or low BP? Unknown Did it involve sudden or severe rash/hives, skin peeling, or any reaction on the inside of your mouth or nose? Unknown Did you need to seek medical attention at a hospital or doctor's office? Unknownu  When did it last happen? If all above answers are "NO", may proceed with cephalosporin use.    . Sulfa Antibiotics     Unknown reaction    Patient Measurements: Height: 5\' 2"  (157.5 cm) Weight: 121 lb 4.1 oz (55 kg) IBW/kg (Calculated) : 50.1 Adjusted Body Weight:   Vital Signs: BP: 127/59 (02/28 0530) Pulse Rate: 85 (02/28 0545)  Labs: Recent Labs    02/17/2019 2132 01/25/2019 2225  WBC 10.6*  --   HGB 16.3*  --   PLT 249  --   CREATININE  --  0.65    Estimated Creatinine Clearance: 39.9 mL/min (by C-G formula based on SCr of 0.65 mg/dL).  No results for input(s): VANCOTROUGH, VANCOPEAK, VANCORANDOM, GENTTROUGH, GENTPEAK, GENTRANDOM, TOBRATROUGH, TOBRAPEAK, TOBRARND, AMIKACINPEAK, AMIKACINTROU, AMIKACIN in the last 72 hours.   Microbiology: No results found for this or any previous visit (from the past 720 hour(s)).  Medical History: Past Medical History:  Diagnosis Date  . Alzheimer's dementia ([place])   . Asthma   . Blindness of left eye   . Cancer ([place])    basal cell carncinoma  . COPD (chronic obstructive pulmonary disease) ([place])   . Depression   . Diverticulosis   . Dysphagia   . Glaucoma   . Hyperlipidemia   . Hypertension   . Insomnia   . Vitamin D deficiency     Medications:  Scheduled:  . amLODipine  2.5 mg Oral QPM  . atorvastatin  10 mg Oral QPM  . ipratropium-albuterol  3 mL Nebulization Q6H  . montelukast  10 mg Oral QPM  . potassium chloride  20 mEq  Oral BID  . predniSONE  40 mg Oral BID WC  . sertraline  25 mg Oral QPM  . sodium chloride flush  3 mL Intravenous Q12H   Anti-infectives (From admission, onward)   Start     Dose/Rate Route Frequency Ordered Stop   02/19/19 0230  piperacillin-tazobactam (ZOSYN) IVPB 3.375 g     3.375 g 12.5 mL/hr over 240 Minutes Intravenous  Once 02/19/19 0223        Assessment: 85 yo female starting zosyn for aspiration PNA. Patient has unknown allergy to penicillin.  Discussed with RN who will watch patient closely for any reactions.   Plan:  Preliminary review of pertinent patient information completed.  Protocol will be initiated with dose(s) of zosyn 3.375mg  x 1.  Forestine Na clinical pharmacist will complete review during morning rounds to assess patient and finalize treatment regimen if needed.  Nyra Capes, [facility] 02/19/2019,6:06 AM

## 2019-02-19 NOTE — Progress Notes (Signed)
Patient restless and pulling at lines and tele, will not stay in bed. Mid level aware.

## 2019-02-19 NOTE — ED Notes (Signed)
Date and time results received: 02/19/19 8:01 AM  (use smartphrase ".now" to insert current time)  Test: Troponin Critical Value: 2.08  Name of Provider Notified: Memon  Orders Received? Or Actions Taken?: Orders Received - See Orders for details

## 2019-02-19 NOTE — ED Notes (Signed)
Hospitalist at bedside 

## 2019-02-19 NOTE — ED Notes (Signed)
Patient's family came to nursing station stating patient "could not catch her breath". Patient's 02 sat was reading at 76 percent. Reminded patient to slow breathing down, breath through her nose and blow out. Patient's 02 sat is now 92 percent. RN Jacqulynn Cadet made aware.

## 2019-02-19 NOTE — Consult Note (Addendum)
Cardiology Consultation:   Patient ID: Sheila Flores; [id number]; 07/21/1930   Admit date: 02/19/2019 Date of Consult: 02/19/2019  Primary Care Provider: System, Provider Not In Primary Cardiologist: Rozann Lesches, MD (new) Primary Electrophysiologist:  None  Chief Complaint: choked on spaghetti  Patient Profile:   Sheila Flores is a 85 y.o. female with a hx of severe hearing loss without hearing aids in, reported Alzheimer's dementia, COPD, depression, diverticulosis, HTN, HLD  who is being seen today for the evaluation of troponin of 2 at the request of Dr. Roderic Palau.  History of Present Illness:   Sheila Flores has no prior cardiac history that we are aware of, but the patient is an unreliable historian and is severely hard of hearing. Beyond that she clearly has dementia with confabulation making it impossible to truly assess her history. She thinks she is in [place] somewhere and came here "because there was something they needed to look at but I just can't think of what." IM tried to contact family this morning but was unable to. Notes indicate the patient came in via EMS after choking after eating spaghetti. Per EMS she had cyanosis of the lips with hypoxia in the 70s. She had another episode of aspiration in the ER. She was started on Zosyn for suspected aspiration PNA. Labs show leukocytosis as well as troponin of 0.09-1.13-2.08 and hypokalemia. She reports SOB but denies any CP, although she answers the question with very vague answer. She appears quite thin. CXR with COPD and cardiomegaly.  Past Medical History:  Diagnosis Date  . Alzheimer's dementia ([place])   . Asthma   . Blindness of left eye   . Cancer ([place])    basal cell carncinoma  . COPD (chronic obstructive pulmonary disease) ([place])   . Depression   . Diverticulosis   . Dysphagia   . Glaucoma   . Hyperlipidemia   . Hypertension   . Insomnia   . Vitamin D deficiency     Past Surgical History:  Procedure  Laterality Date  . ABDOMINAL HYSTERECTOMY    . CATARACT EXTRACTION    . CHOLECYSTECTOMY    . LUNG REMOVAL, PARTIAL Left 1960  . TOTAL HIP ARTHROPLASTY Left      Inpatient Medications: Scheduled Meds: . heparin  3,300 Units Intravenous Once  . ipratropium-albuterol  3 mL Nebulization Q6H  . methylPREDNISolone (SOLU-MEDROL) injection  60 mg Intravenous Q12H  . sodium chloride flush  3 mL Intravenous Q12H   Continuous Infusions: . sodium chloride    . heparin    . piperacillin-tazobactam (ZOSYN)  IV     PRN Meds: sodium chloride, albuterol, sodium chloride flush  Home Meds: Prior to Admission medications   Medication Sig Start Date End Date Taking? Authorizing Provider  albuterol (PROVENTIL) (2.5 MG/3ML) 0.083% nebulizer solution Take 1 ampule by nebulization 3 (three) times daily as needed for wheezing or shortness of breath.  09/10/18  Yes [provider]  amLODipine (NORVASC) 2.5 MG tablet Take 2.5 mg by mouth every evening.  03/25/18  Yes [provider]  atorvastatin (LIPITOR) 10 MG tablet Take 10 mg by mouth every evening.  03/25/18  Yes [provider]  montelukast (SINGULAIR) 10 MG tablet Take 10 mg by mouth every evening.  02/08/19  Yes [provider]  sertraline (ZOLOFT) 25 MG tablet Take 25 mg by mouth every evening.  12/25/18  Yes [provider]    Allergies:    Allergies  Allergen Reactions  . Cephalexin  Unknown reaction  . Clarithromycin     Unknown reaction  . Penicillins     Did it involve swelling of the face/tongue/throat, SOB, or low BP? Unknown Did it involve sudden or severe rash/hives, skin peeling, or any reaction on the inside of your mouth or nose? Unknown Did you need to seek medical attention at a hospital or doctor's office? Unknownu  When did it last happen? If all above answers are "NO", may proceed with cephalosporin use.    . Sulfa Antibiotics     Unknown reaction    Social History:     Social History   Socioeconomic History  . Marital status: Single    Spouse name: Not on file  . Number of children: Not on file  . Years of education: Not on file  . Highest education level: Not on file  Occupational History  . Not on file  Social Needs  . Financial resource strain: Not on file  . Food insecurity:    Worry: Not on file    Inability: Not on file  . Transportation needs:    Medical: Not on file    Non-medical: Not on file  Tobacco Use  . Smoking status: Former Research scientist (life sciences)  . Smokeless tobacco: Never Used  Substance and Sexual Activity  . Alcohol use: Never    Frequency: Never  . Drug use: Never  . Sexual activity: Not Currently  Lifestyle  . Physical activity:    Days per week: Not on file    Minutes per session: Not on file  . Stress: Not on file  Relationships  . Social connections:    Talks on phone: Not on file    Gets together: Not on file    Attends religious service: Not on file    Active member of club or organization: Not on file    Attends meetings of clubs or organizations: Not on file    Relationship status: Not on file  . Intimate partner violence:    Fear of current or ex partner: Not on file    Emotionally abused: Not on file    Physically abused: Not on file    Forced sexual activity: Not on file  Other Topics Concern  . Not on file  Social History Narrative  . Not on file    Family History:   The patient's family history includes Cancer in her mother; Heart failure in her father and mother.  ROS:  Unreliable historian due to dementia  Physical Exam/Data:   Vitals:   02/19/19 0630 02/19/19 0700 02/19/19 0730 02/19/19 0748  BP: (!) 118/51 (!) 133/95 (!) 129/53   Pulse: 80 88 80   Resp: (!) 27 (!) 22 (!) 27   SpO2: 100% 100% 99% 100%  Weight:      Height:        Intake/Output Summary (Last 24 hours) at 02/19/2019 0942 Last data filed at 02/19/2019 9323 Gross per 24 hour  Intake 250 ml  Output -  Net 250 ml   Last 3  Weights 02/11/2019  Weight (lbs) 121 lb 4.1 oz  Weight (kg) 55 kg    Body mass index is 22.18 kg/m.  General: Well developed, well nourished, in no acute distress. Head: Normocephalic, atraumatic, sclera non-icteric, no xanthomas, nares are without discharge.  Neck: Negative for carotid bruits. JVD not elevated. Lungs: Clear bilaterally to auscultation without wheezes, rales, or rhonchi. Breathing is unlabored. Heart: RRR with S1 S2. No murmurs, rubs, or gallops appreciated. Abdomen:  Soft, non-tender, non-distended with normoactive bowel sounds. No hepatomegaly. No rebound/guarding. No obvious abdominal masses. Msk:  Strength and tone appear normal for age. Extremities: No clubbing or cyanosis. No edema.  Distal pedal pulses are 2+ and equal bilaterally. Neuro: Alert and oriented X 3. No facial asymmetry. No focal deficit. Moves all extremities spontaneously. Psych:  Responds to questions appropriately with a normal affect.  EKG:  The EKG was personally reviewed and demonstrates sinus tachycardia, baseline wander, nonspecific STT changes. Repeat pending.   Laboratory Data:  Chemistry Recent Labs  Lab 02/17/2019 2225 02/19/19 0703  NA 136 138  K 2.6* 3.2*  CL 94* 99  CO2 31 28  GLUCOSE 186* 188*  BUN 7* 9  CREATININE 0.65 0.70  CALCIUM 8.8* 8.3*  GFRNONAA >60 >60  GFRAA >60 >60  ANIONGAP 11 11    Recent Labs  Lab 02/13/2019 2225  PROT 7.4  ALBUMIN 4.1  AST 24  ALT 14  ALKPHOS 71  BILITOT 0.4   Hematology Recent Labs  Lab 01/31/2019 2132 02/19/19 0703  WBC 10.6* 15.5*  RBC 5.83* 5.23*  HGB 16.3* 14.9  HCT 54.0* 48.5*  MCV 92.6 92.7  MCH 28.0 28.5  MCHC 30.2 30.7  RDW 12.5 12.5  PLT 249 239   Cardiac Enzymes Recent Labs  Lab 02/04/2019 2225 02/19/19 0030 02/19/19 0703  TROPONINI 0.09* 1.13* 2.08*   No results for input(s): TROPIPOC in the last 168 hours.  BNPNo results for input(s): BNP, PROBNP in the last 168 hours.  DDimer No results for input(s): DDIMER  in the last 168 hours.  Radiology/Studies:  Dg Chest Portable 1 View  Result Date: 01/28/2019 CLINICAL DATA:  Shortness of breath EXAM: PORTABLE CHEST 1 VIEW COMPARISON:  None. FINDINGS: There is hyperinflation of the lungs compatible with COPD. Mild cardiomegaly. No confluent airspace opacities, effusions or edema. No acute bony abnormality. IMPRESSION: COPD, cardiomegaly.  No active disease. Electronically Signed   By: Rolm Baptise M.D.   On: 01/30/2019 21:56    Assessment and Plan:   1. Choking on spaghetti with suspected aspiration and possible AECOPD - swallow eval pending, on abx, steroids and nebs per primary team.  2. Elevated troponin - do not suspect ACS is the primary issue - likely elevated related to demand ischemia due to profound hypoxia on admission. Cannot exclude this representing underlying CAD. Agree with IV heparin for 48 hours. Will discuss whether we need to do rectal ASA with MD given that she has not yet been cleared for oral meds. Hold off BB given COPD exacerbation. Resume statin when cleared to take PO. Given advanced age, dementia, frailty she does not appear to be a great candidate for invasive ischemic eval. Would be reasonable to consider echocardiogram to establish a baseline and understand how the demand ischemia has affected her structurally.  3. HTN - BP normal currently.  4. Hyperlipidemia - see above re: statin.   For questions or updates, please contact [place] Please consult [email or web address] for contact info under Cardiology/STEMI.    Signed, Charlie Pitter, PA-C  02/19/2019 9:42 AM    Attending note:  Patient seen and examined.  I reviewed records and discussed the case with Ms. Purcell Mouton.  Ms. Friscia is an 85 year old woman with Alzheimer's dementia and severe hearing loss, COPD, hypertension, and hyperlipidemia.  She is admitted to the hospital with suspected aspiration and COPD exacerbation after choking on spaghetti.  Was noted to be  hypoxic on evaluation by EMS and has  been treated with empiric antibiotics and steroids, in addition to nebulizers and supplemental oxygen.  Chest x-ray reports COPD with cardiomegaly but no acute process.  As part of her evaluation cardiac enzymes were obtained and found to be abnormal.  Troponin I has increased from 0.09 up to 1.13, and then 2.08.  Patient does not report any obvious chest pain in addition to her shortness of breath, but is not a good historian.  I personally reviewed her ECG which shows sinus tachycardia with decreased anteroseptal R wave progression and nonspecific ST changes in the setting of motion artifact.  She is in no distress on examination.  Heart rate is in the 80s in sinus rhythm by telemetry and systolic blood pressures ranging 110-130.  Lungs exhibit no active wheezing, cardiac exam reveals RRR without gallop.  Lab work shows potassium 3.2, BUN 9, creatinine 0.70, WBC 15.5, hemoglobin 14.9, platelets 239.  Patient is being admitted to the hospitalist service with elevated troponin I level in the setting of suspected aspiration and possible COPD exacerbation.  History is somewhat difficult to elicit, but she denies active chest pain.  Although in the range of NSTEMI, this still could represent demand ischemia and her ECG shows nonspecific ST-T changes.  I do not anticipate aggressive, invasive cardiac work-up at this point, although an echocardiogram to establish LVEF and assess for wall motion abnormalities would be reasonable in helping to further guide her care.  At a minimum, medications can be adjusted for treatment of ischemic heart disease.  Satira Sark, M.D., F.A.C.C.

## 2019-02-19 NOTE — ED Notes (Signed)
Date and time results received: 02/19/19 0127   Test: Troponin Critical Value: 1.13  Name of Provider Notified: Shanon Brow, MD

## 2019-02-19 NOTE — Evaluation (Signed)
Clinical/Bedside Swallow Evaluation Patient Details  Name: Sheila Flores MRN: [id number] Date of Birth: 12-01-1930  Today's Date: 02/19/2019 Time: SLP Start Time (ACUTE ONLY): 6606 SLP Stop Time (ACUTE ONLY): 1119 SLP Time Calculation (min) (ACUTE ONLY): 32 min  Past Medical History:  Past Medical History:  Diagnosis Date  . Alzheimer's dementia ([place])   . Asthma   . Blindness of left eye   . Cancer ([place])    basal cell carncinoma  . COPD (chronic obstructive pulmonary disease) ([place])   . Depression   . Diverticulosis   . Dysphagia   . Glaucoma   . Hyperlipidemia   . Hypertension   . Insomnia   . Vitamin D deficiency    Past Surgical History:  Past Surgical History:  Procedure Laterality Date  . ABDOMINAL HYSTERECTOMY    . CATARACT EXTRACTION    . CHOLECYSTECTOMY    . LUNG REMOVAL, PARTIAL Left 1960  . TOTAL HIP ARTHROPLASTY Left    HPI:  Sheila Flores is a 85 y.o. female with medical history significant of dementia, COPD not on home oxygen supplementation, asthma comes in with son after she choked on some spaghetti they think.  Patient sometimes coughs and chokes a lot when she is eating and drinking.  She does have COPD but does not require home oxygen supplementation.  After the choking episode and coughing she was very short of breath so they brought her the emergency department.  The emergency department she was found to be hypoxic was placed on 3 L of oxygen with good O2 sats.  She was weak wheezing diffusely.  Patient is being referred for admission for COPD exacerbation.  As I am walking in the room to do her evaluation she again clearly choked while swallowing 1 of her pills provided to her by the emergency room nurse.   Assessment / Plan / Recommendation Clinical Impression  Pt was administered clinical swallowing evaluation while Pt was seated upright in bed; she is very hard of hearing however when she is able to hear instructions is cooperative, pleasant and  follows commands. Pt presented with s/sx oropharyngeal dysphagia including delayed coughing throughout trials of thin liquids, puree and mechanical soft textures; multiple swallows also noted. No immediate or overt coughing was noted; Question potential esophageal component secondary to belching throughout trials and reported globus sensation. During trials with mech soft pt reported "that won't go down" but immediately grabbed mech soft trials and thin liquids to continue eating and drinking, quite impulsively, at which point an audible gurgle was noted. Secondary to repeated discomfort with swallowing, reports of choking and delayed coughing noted during CSE recommend proceed with instrumental testing to objectively assess the swallowing function and determine LRD. Plan to proceed with MBS this am. Thank you for this referral, SLP Visit Diagnosis: Dysphagia, unspecified (R13.10)    Aspiration Risk  Mild aspiration risk    Diet Recommendation   Await instrumental testing to determine LRD       Other  Recommendations Recommended Consults: Consider GI evaluation Oral Care Recommendations: Oral care QID   Follow up Recommendations               Prognosis Prognosis for Safe Diet Advancement: Good      Swallow Study   General Date of Onset: 02/05/2019 HPI: Sheila Flores is a 85 y.o. female with medical history significant of dementia, COPD not on home oxygen supplementation, asthma comes in with son after she choked on some spaghetti they think.  Patient  sometimes coughs and chokes a lot when she is eating and drinking.  She does have COPD but does not require home oxygen supplementation.  After the choking episode and coughing she was very short of breath so they brought her the emergency department.  The emergency department she was found to be hypoxic was placed on 3 L of oxygen with good O2 sats.  She was weak wheezing diffusely.  Patient is being referred for admission for COPD  exacerbation.  As I am walking in the room to do her evaluation she again clearly choked while swallowing 1 of her pills provided to her by the emergency room nurse. Type of Study: Bedside Swallow Evaluation Previous Swallow Assessment: none in chart Diet Prior to this Study: NPO Temperature Spikes Noted: No Respiratory Status: Nasal cannula History of Recent Intubation: No Behavior/Cognition: Alert;Cooperative;Pleasant mood Oral Cavity Assessment: Within Functional Limits Oral Care Completed by SLP: Recent completion by staff Oral Cavity - Dentition: Dentures, top(lower dentures not available) Vision: Functional for self-feeding Self-Feeding Abilities: Able to feed self Patient Positioning: Upright in bed Baseline Vocal Quality: Normal Volitional Cough: Strong Volitional Swallow: Unable to elicit    Oral/Motor/Sensory Function Overall Oral Motor/Sensory Function: Within functional limits   Ice Chips Ice chips: Within functional limits   Thin Liquid Thin Liquid: Impaired Presentation: Cup;Spoon;Straw Pharyngeal  Phase Impairments: Multiple swallows;Throat Clearing - Delayed;Cough - Delayed;Suspected delayed Swallow    Nectar Thick Nectar Thick Liquid: Not tested   Honey Thick Honey Thick Liquid: Not tested   Puree Puree: Within functional limits   Solid     Solid: Impaired Pharyngeal Phase Impairments: Throat Clearing - Delayed;Multiple swallows     Sheila Flores H. Roddie Mc, [place] Speech Language Pathologist  Sheila Flores 02/19/2019,11:38 AM

## 2019-02-19 NOTE — H&P (Signed)
History and Physical    Holly Iannaccone PYK:[id number] DOB: Aug 07, 1930 DOA: 02/04/2019  PCP: System, Provider Not In  Patient coming from: Home  Chief Complaint: Choked on some spaghetti  HPI: Sheila Flores is a 85 y.o. female with medical history significant of dementia, COPD not on home oxygen supplementation, asthma comes in with son after she choked on some spaghetti they think.  Patient sometimes coughs and chokes a lot when she is eating and drinking.  She has not been having any fevers.  No nausea vomiting diarrhea.  No recent illnesses.  She does have COPD but does not require home oxygen supplementation.  After the choking episode and coughing she was very short of breath so they brought her the emergency department.  The emergency department she was found to be hypoxic was placed on 3 L of oxygen with good O2 sats.  She was weak wheezing diffusely.  Patient is being referred for admission for COPD exacerbation.  As I am walking in the room to do her evaluation she again clearly choked while swallowing 1 of her pills provided to her by the emergency room nurse.  Review of Systems: As per HPI otherwise 10 point review of systems negative.   Past Medical History:  Diagnosis Date  . Alzheimer's dementia ([place])   . Asthma   . Blindness of left eye   . Cancer ([place])    basal cell carncinoma  . COPD (chronic obstructive pulmonary disease) ([place])   . Depression   . Diverticulosis   . Dysphagia   . Glaucoma   . Hyperlipidemia   . Hypertension   . Insomnia   . Vitamin D deficiency     Past Surgical History:  Procedure Laterality Date  . ABDOMINAL HYSTERECTOMY    . CATARACT EXTRACTION    . CHOLECYSTECTOMY    . LUNG REMOVAL, PARTIAL Left 1960  . TOTAL HIP ARTHROPLASTY Left      reports that she has quit smoking. She has never used smokeless tobacco. She reports that she does not drink alcohol or use drugs.  Allergies  Allergen Reactions  . Cephalexin     Unknown reaction  .  Clarithromycin     Unknown reaction  . Penicillins     Did it involve swelling of the face/tongue/throat, SOB, or low BP? Unknown Did it involve sudden or severe rash/hives, skin peeling, or any reaction on the inside of your mouth or nose? Unknown Did you need to seek medical attention at a hospital or doctor's office? Unknownu  When did it last happen? If all above answers are "NO", may proceed with cephalosporin use.    . Sulfa Antibiotics     Unknown reaction    Family History  Problem Relation Age of Onset  . Cancer Mother   . Heart failure Mother   . Heart failure Father     Prior to Admission medications   Medication Sig Start Date End Date Taking? Authorizing Provider  albuterol (PROVENTIL) (2.5 MG/3ML) 0.083% nebulizer solution Take 1 ampule by nebulization 3 (three) times daily as needed for wheezing or shortness of breath.  09/10/18  Yes [provider]  amLODipine (NORVASC) 2.5 MG tablet Take 2.5 mg by mouth every evening.  03/25/18  Yes [provider]  atorvastatin (LIPITOR) 10 MG tablet Take 10 mg by mouth every evening.  03/25/18  Yes [provider]  montelukast (SINGULAIR) 10 MG tablet Take 10 mg by mouth every evening.  02/08/19  Yes [provider]  sertraline (ZOLOFT) 25 MG tablet Take 25 mg by mouth every evening.  12/25/18  Yes [provider]    Physical Exam: Vitals:   02/13/2019 2200 01/29/2019 2230 02/03/2019 2300 02/14/2019 2330  BP: (!) 140/104 138/74 138/63 (!) 155/72  Pulse: (!) 118 (!) 127 (!) 112 (!) 112  Resp: (!) 25 (!) 27 (!) 31   SpO2: 98% 93% 94% 92%  Weight:      Height:          Constitutional: NAD, calm, comfortable Vitals:   01/27/2019 2200 02/09/2019 2230 01/28/2019 2300 02/06/2019 2330  BP: (!) 140/104 138/74 138/63 (!) 155/72  Pulse: (!) 118 (!) 127 (!) 112 (!) 112  Resp: (!) 25 (!) 27 (!) 31   SpO2: 98% 93% 94% 92%  Weight:      Height:       Eyes: PERRL, lids and conjunctivae normal ENMT:  Mucous membranes are moist. Posterior pharynx clear of any exudate or lesions.Normal dentition.  Neck: normal, supple, no masses, no thyromegaly Respiratory: clear to auscultation bilaterally, expiratory bilateral wheezing, no crackles. Normal respiratory effort. No accessory muscle use.  Cardiovascular: Regular rate and rhythm, no murmurs / rubs / gallops. No extremity edema. 2+ pedal pulses. No carotid bruits.  Abdomen: no tenderness, no masses palpated. No hepatosplenomegaly. Bowel sounds positive.  Musculoskeletal: no clubbing / cyanosis. No joint deformity upper and lower extremities. Good ROM, no contractures. Normal muscle tone.  Skin: no rashes, lesions, ulcers. No induration Neurologic: CN 2-12 grossly intact. Sensation intact, DTR normal. Strength 5/5 in all 4.  Psychiatric: Normal judgment and insight. Alert and oriented x 3. Normal mood.    Labs on Admission: I have personally reviewed following labs and imaging studies  CBC: Recent Labs  Lab 02/02/2019 2132  WBC 10.6*  NEUTROABS 7.5  HGB 16.3*  HCT 54.0*  MCV 92.6  PLT 761   Basic Metabolic Panel: Recent Labs  Lab 02/03/2019 2225  NA 136  K 2.6*  CL 94*  CO2 31  GLUCOSE 186*  BUN 7*  CREATININE 0.65  CALCIUM 8.8*   GFR: Estimated Creatinine Clearance: 39.9 mL/min (by C-G formula based on SCr of 0.65 mg/dL). Liver Function Tests: Recent Labs  Lab 02/11/2019 2225  AST 24  ALT 14  ALKPHOS 71  BILITOT 0.4  PROT 7.4  ALBUMIN 4.1   No results for input(s): LIPASE, AMYLASE in the last 168 hours. No results for input(s): AMMONIA in the last 168 hours. Coagulation Profile: No results for input(s): INR, PROTIME in the last 168 hours. Cardiac Enzymes: Recent Labs  Lab 02/14/2019 2225  TROPONINI 0.09*   BNP (last 3 results) No results for input(s): PROBNP in the last 8760 hours. HbA1C: No results for input(s): HGBA1C in the last 72 hours. CBG: Recent Labs  Lab 01/24/2019 2357  GLUCAP 150*   Lipid  Profile: No results for input(s): CHOL, HDL, LDLCALC, TRIG, CHOLHDL, LDLDIRECT in the last 72 hours. Thyroid Function Tests: No results for input(s): TSH, T4TOTAL, FREET4, T3FREE, THYROIDAB in the last 72 hours. Anemia Panel: No results for input(s): VITAMINB12, FOLATE, FERRITIN, TIBC, IRON, RETICCTPCT in the last 72 hours. Urine analysis: No results found for: COLORURINE, APPEARANCEUR, LABSPEC, PHURINE, GLUCOSEU, HGBUR, BILIRUBINUR, KETONESUR, PROTEINUR, UROBILINOGEN, NITRITE, LEUKOCYTESUR Sepsis Labs: !!!!!!!!!!!!!!!!!!!!!!!!!!!!!!!!!!!!!!!!!!!! @LABRCNTIP (procalcitonin:4,lacticidven:4) )No results found for this or any previous visit (from the past 240 hour(s)).   Radiological Exams on Admission: Dg Chest Portable 1 View  Result Date: 01/30/2019 CLINICAL DATA:  Shortness of breath EXAM: PORTABLE CHEST 1  VIEW COMPARISON:  None. FINDINGS: There is hyperinflation of the lungs compatible with COPD. Mild cardiomegaly. No confluent airspace opacities, effusions or edema. No acute bony abnormality. IMPRESSION: COPD, cardiomegaly.  No active disease. Electronically Signed   By: Rolm Baptise M.D.   On: 02/10/2019 21:56   Old chart reviewed Chest x-ray reviewed no edema or infiltrate Case discussed with Dr. Roderic Palau in the ED  Assessment/Plan 85 year old female with COPD exacerbation and signs and symptoms of aspirating Principal Problem:   COPD exacerbation (HCC)-frequent nebulizer treatments.  Steroids.  Oxygen supplementation and wean as tolerates.  Check swallow evaluation.  Active Problems:   Hypokalemia-provide IV potassium replacement overnight   Alzheimer's dementia (HCC)-noted   Hypertension-stable   Hyperlipidemia-stable   Choking episode-checking modified barium swallow study evaluation and also putting her on Zosyn empirically for aspirating   DVT prophylaxis: SCDs Code Status: Full Family Communication: Son Disposition Plan: 1 to 2 days Consults called: None Admission  status: Admission   Ladonne Sharples A MD [facility]  If 7PM-7AM, please contact night-coverage [email or web address] Password [facility]  02/19/2019, 12:14 AM

## 2019-02-19 NOTE — Progress Notes (Signed)
Patient admitted to the hospital earlier this morning by Dr. Shanon Brow  Patient seen and examined.  History is limited since she is EXTREMELY hard of hearing.  No family is present.  I tried to contact the patient's son over the phone, left a voicemail.  Her lungs are clear bilaterally.  She is not appear to be in any distress.  She does admit to having some left-sided chest pain.  Unclear as to how frequently this is occurring.  She does not have any lower extremity edema.  This is an 85 year old female with a history of dementia, COPD, was brought into the hospital after she choked on food.  She became increasingly short of breath and required oxygen supplementation.  He was felt that she was likely aspirating.  She had another episode in the emergency room.  She is been started on Zosyn for suspected aspiration.  Swallow evaluation has been requested.  Through her hospital course, troponin has trended up to 2.08.  EKG was a poor quality tracing, but did not show any acute changes.  Will repeat EKG.  She does complain of some chest discomfort, but is unclear if this is related to her respiratory findings/coughing versus cardiac chest pain.  We will start the patient on intravenous heparin for non-STEMI.  Continue to cycle troponins.  Check echocardiogram.  Will consult cardiology.  It would likely be prudent to sort out swallowing issues prior to further invasive ischemic testing (if indicated) to ensure that she would be able to take oral antiplatelet agents.  [employer]

## 2019-02-19 NOTE — Progress Notes (Signed)
*  PRELIMINARY RESULTS* Echocardiogram 2D Echocardiogram has been performed.  Sheila Flores 02/19/2019, 2:30 PM

## 2019-02-20 ENCOUNTER — Encounter (HOSPITAL_COMMUNITY): Payer: Self-pay | Admitting: Gastroenterology

## 2019-02-20 ENCOUNTER — Inpatient Hospital Stay (HOSPITAL_COMMUNITY): Payer: Medicare HMO

## 2019-02-20 DIAGNOSIS — R1314 Dysphagia, pharyngoesophageal phase: Secondary | ICD-10-CM

## 2019-02-20 DIAGNOSIS — T17908D Unspecified foreign body in respiratory tract, part unspecified causing other injury, subsequent encounter: Secondary | ICD-10-CM

## 2019-02-20 DIAGNOSIS — E785 Hyperlipidemia, unspecified: Secondary | ICD-10-CM

## 2019-02-20 LAB — CBC
HEMATOCRIT: 41.2 % (ref 36.0–46.0)
Hemoglobin: 13.1 g/dL (ref 12.0–15.0)
MCH: 29 pg (ref 26.0–34.0)
MCHC: 31.8 g/dL (ref 30.0–36.0)
MCV: 91.2 fL (ref 80.0–100.0)
Platelets: 215 10*3/uL (ref 150–400)
RBC: 4.52 MIL/uL (ref 3.87–5.11)
RDW: 12.8 % (ref 11.5–15.5)
WBC: 10.2 10*3/uL (ref 4.0–10.5)
nRBC: 0 % (ref 0.0–0.2)

## 2019-02-20 LAB — BLOOD GAS, ARTERIAL
Acid-Base Excess: 5.8 mmol/L — ABNORMAL HIGH (ref 0.0–2.0)
Bicarbonate: 26.3 mmol/L (ref 20.0–28.0)
Delivery systems: POSITIVE
Expiratory PAP: 8
FIO2: 100
Inspiratory PAP: 18
O2 Saturation: 99 %
Patient temperature: 37
RATE: 28 resp/min
pCO2 arterial: 87.4 mmHg (ref 32.0–48.0)
pH, Arterial: 7.207 — ABNORMAL LOW (ref 7.350–7.450)
pO2, Arterial: 240 mmHg — ABNORMAL HIGH (ref 83.0–108.0)

## 2019-02-20 LAB — BASIC METABOLIC PANEL
Anion gap: 8 (ref 5–15)
BUN: 11 mg/dL (ref 8–23)
CHLORIDE: 102 mmol/L (ref 98–111)
CO2: 30 mmol/L (ref 22–32)
Calcium: 8.3 mg/dL — ABNORMAL LOW (ref 8.9–10.3)
Creatinine, Ser: 0.62 mg/dL (ref 0.44–1.00)
GFR calc Af Amer: >60 mL/min (ref >60)
GFR calc non Af Amer: >60 mL/min (ref >60)
Glucose, Bld: 129 mg/dL — ABNORMAL HIGH (ref 70–99)
POTASSIUM: 3.5 mmol/L (ref 3.5–5.1)
Sodium: 140 mmol/L (ref 135–145)

## 2019-02-20 LAB — HEPARIN LEVEL (UNFRACTIONATED)
Heparin Unfractionated: 0.6 IU/mL (ref 0.30–0.70)
Heparin Unfractionated: 0.73 IU/mL — ABNORMAL HIGH (ref 0.30–0.70)

## 2019-02-20 LAB — TROPONIN I: Troponin I: 1.91 ng/mL (ref <0.03)

## 2019-02-20 MED ORDER — ALBUTEROL SULFATE (2.5 MG/3ML) 0.083% IN NEBU
INHALATION_SOLUTION | RESPIRATORY_TRACT | Status: AC
  Administered 2019-02-20: 2.5 mg
  Filled 2019-02-20: qty 3

## 2019-02-20 MED ORDER — LORAZEPAM 2 MG/ML IJ SOLN
0.5000 mg | Freq: Four times a day (QID) | INTRAMUSCULAR | Status: DC | PRN
  Administered 2019-02-20 – 2019-02-26 (×11): 0.5 mg via INTRAVENOUS
  Filled 2019-02-20 (×11): qty 1

## 2019-02-20 MED ORDER — HALOPERIDOL LACTATE 5 MG/ML IJ SOLN: 5.0000 mg | Freq: Once | INTRAMUSCULAR | Status: DC

## 2019-02-20 MED ORDER — MORPHINE SULFATE (PF) 2 MG/ML IV SOLN
2.0000 mg | Freq: Once | INTRAVENOUS | Status: AC
  Administered 2019-02-20: 2 mg via INTRAVENOUS
  Filled 2019-02-20: qty 1

## 2019-02-20 MED ORDER — PANTOPRAZOLE SODIUM 40 MG PO TBEC
40.0000 mg | DELAYED_RELEASE_TABLET | Freq: Every day | ORAL | Status: DC
  Administered 2019-02-20 – 2019-02-24 (×5): 40 mg via ORAL
  Filled 2019-02-20 (×6): qty 1

## 2019-02-20 NOTE — Progress Notes (Signed)
PCO2 of 87.4 called by lab at 2056. MD notified via text page at 2057. Pt already on BiPAP transferred from 300 to ICU.

## 2019-02-20 NOTE — Progress Notes (Signed)
PROGRESS NOTE    Sheila Flores  EXH:[id number] DOB: [birth year] DOA: 01/26/2019 PCP: System, Provider Not In    Brief Narrative:  85 year old female brought to the hospital after having a choking episode.  She has a history of COPD, hypertension.  She was noted to be short of breath and wheezing.  She was seen by speech therapy and underwent modified barium swallow.  She was also seen by GI for possible esophageal component of dysphasia.  She is currently on a full liquid diet.  She also had elevation of troponin felt to be related to demand ischemia.  Currently on treatment with intravenous heparin.  GI and cardiology following.   Assessment & Plan:   Principal Problem:   COPD exacerbation ([place]) Active Problems:   Alzheimer's dementia ([place])   Hypertension   Hyperlipidemia   Hypokalemia   Choking episode   Elevated troponin   Dysphagia   Aspiration into airway   1. Dysphagia with aspiration.  Seen by speech therapy and underwent modified barium swallow study.  It was felt that her aspiration may be more related to esophageal causes.  She was seen by GI not felt to be a candidate for EGD at this time due to cardiac/respiratory issues.  She was started on a diet by speech therapy, but continues to have coughing/shortness of breath with p.o. intake.  Will change diet to full liquids for now continue to monitor. 2. COPD exacerbation.  Wheezing precipitated by aspiration.  Continue on antibiotics and bronchodilators as well as steroids. 3. Elevated troponin, felt to be secondary to demand ischemia.  Troponin has peaked at 2.24.  Echocardiogram shows normal ejection fraction, although does comment on hypokinetic apex.  Cardiology following.  Currently on medical management with intravenous heparin and aspirin. 4. Hypokalemia.  Replaced 5. Hypertension.  Blood pressure currently stable.  Continue to monitor. 6. Right basal frontal meningioma.  Followed at [facility] neurosurgery clinic.   DVT  prophylaxis: Heparin infusion Code Status: Full code Family Communication: Discussed with son at the bedside Disposition Plan: Discharge home when she is able to tolerate p.o. intake   Consultants:   Cardiology  GI  Procedures:  EGD: 1. Stage 1: 1: Entire apex is abnormal.  2. The left ventricle has normal systolic function with an ejection fraction of 60-65%. The cavity size was normal. There is moderate asymmetric left ventricular hypertrophy. Left ventricular diastolic Doppler parameters are consistent with impaired  relaxation Elevated left atrial and left ventricular end-diastolic pressures.  3. The right ventricle has normal systolic function. The cavity was normal. There is no increase in right ventricular wall thickness.  4. Left atrial size was severely dilated.  5. The mitral valve is normal in structure. There is mild mitral annular calcification present. Mitral valve regurgitation is moderate by color flow Doppler.  6. The tricuspid valve is normal in structure.  7. The aortic valve is tricuspid. There is mild stenosis of the aortic valve. Aortic valve regurgitation is mild by color flow Doppler.   8. The aortic root is normal in size and structure.  Antimicrobials:   Zosyn 2/28>   Subjective: Continues to have significant coughing and shortness of breath after eating and drinking.  Objective: Vitals:   02/20/19 0656 02/20/19 0733 02/20/19 1421 02/20/19 1448  BP: (!) 121/57   (!) 159/64  Pulse: 84   (!) 116  Resp: 18   18  Temp:      TempSrc:      SpO2: 100% 98% 97% 95%  Weight:      Height:        Intake/Output Summary (Last 24 hours) at 02/20/2019 1724 Last data filed at 02/20/2019 1300 Gross per 24 hour  Intake 311.5 ml  Output -  Net 311.5 ml   Filed Weights   01/28/2019 2130 02/19/19 1558  Weight: 55 kg 48.3 kg    Examination:  General exam: Appears calm and comfortable  Respiratory system: Bilateral wheezes and rhonchi. Respiratory effort  normal. Cardiovascular system: S1 & S2 heard, RRR. No JVD, murmurs, rubs, gallops or clicks. No pedal edema. Gastrointestinal system: Abdomen is nondistended, soft and nontender. No organomegaly or masses felt. Normal bowel sounds heard. Central nervous system:  No focal neurological deficits. Extremities: Symmetric 5 x 5 power. Skin: No rashes, lesions or ulcers Psychiatry: Judgement and insight appear normal. Mood & affect appropriate.     Data Reviewed: I have personally reviewed following labs and imaging studies  CBC: Recent Labs  Lab 02/17/2019 2132 02/19/19 0703 02/20/19 0238  WBC 10.6* 15.5* 10.2  NEUTROABS 7.5  --   --   HGB 16.3* 14.9 13.1  HCT 54.0* 48.5* 41.2  MCV 92.6 92.7 91.2  PLT 249 239 629   Basic Metabolic Panel: Recent Labs  Lab 01/25/2019 2225 02/19/19 0703 02/20/19 0905  NA 136 138 140  K 2.6* 3.2* 3.5  CL 94* 99 102  CO2 31 28 30   GLUCOSE 186* 188* 129*  BUN 7* 9 11  CREATININE 0.65 0.70 0.62  CALCIUM 8.8* 8.3* 8.3*   GFR: Estimated Creatinine Clearance: 38.5 mL/min (by C-G formula based on SCr of 0.62 mg/dL). Liver Function Tests: Recent Labs  Lab 02/13/2019 2225  AST 24  ALT 14  ALKPHOS 71  BILITOT 0.4  PROT 7.4  ALBUMIN 4.1   No results for input(s): LIPASE, AMYLASE in the last 168 hours. No results for input(s): AMMONIA in the last 168 hours. Coagulation Profile: No results for input(s): INR, PROTIME in the last 168 hours. Cardiac Enzymes: Recent Labs  Lab 02/10/2019 2225 02/19/19 0030 02/19/19 0703 02/19/19 1234 02/20/19 0905  TROPONINI 0.09* 1.13* 2.08* 2.24* 1.91*   BNP (last 3 results) No results for input(s): PROBNP in the last 8760 hours. HbA1C: No results for input(s): HGBA1C in the last 72 hours. CBG: Recent Labs  Lab 02/02/2019 2357  GLUCAP 150*   Lipid Profile: No results for input(s): CHOL, HDL, LDLCALC, TRIG, CHOLHDL, LDLDIRECT in the last 72 hours. Thyroid Function Tests: No results for input(s): TSH,  T4TOTAL, FREET4, T3FREE, THYROIDAB in the last 72 hours. Anemia Panel: No results for input(s): VITAMINB12, FOLATE, FERRITIN, TIBC, IRON, RETICCTPCT in the last 72 hours. Sepsis Labs: No results for input(s): PROCALCITON, LATICACIDVEN in the last 168 hours.  No results found for this or any previous visit (from the past 240 hour(s)).       Radiology Studies: Dg Chest Portable 1 View  Result Date: 01/30/2019 CLINICAL DATA:  Shortness of breath EXAM: PORTABLE CHEST 1 VIEW COMPARISON:  None. FINDINGS: There is hyperinflation of the lungs compatible with COPD. Mild cardiomegaly. No confluent airspace opacities, effusions or edema. No acute bony abnormality. IMPRESSION: COPD, cardiomegaly.  No active disease. Electronically Signed   By: Rolm Baptise M.D.   On: 01/27/2019 21:56   Dg Swallowing Func-speech Pathology  Result Date: 02/19/2019 Objective Swallowing Evaluation: Type of Study: MBS-Modified Barium Swallow Study  Patient Details Name: Sheila Flores MRN: [id number] Date of Birth: 1930/01/19 Today's Date: 02/19/2019 Time: SLP Start Time (ACUTE ONLY): 1157 -  SLP Stop Time (ACUTE ONLY): 1223 SLP Time Calculation (min) (ACUTE ONLY): 26 min Past Medical History: Past Medical History: Diagnosis Date . Alzheimer's dementia ([place])  . Asthma  . Blindness of left eye  . Cancer ([place])   basal cell carncinoma . COPD (chronic obstructive pulmonary disease) ([place])  . Depression  . Diverticulosis  . Dysphagia  . Glaucoma  . Hyperlipidemia  . Hypertension  . Insomnia  . Vitamin D deficiency  Past Surgical History: Past Surgical History: Procedure Laterality Date . ABDOMINAL HYSTERECTOMY   . CATARACT EXTRACTION   . CHOLECYSTECTOMY   . LUNG REMOVAL, PARTIAL Left 1960 . TOTAL HIP ARTHROPLASTY Left  HPI: Sheila Flores is a 85 y.o. female with medical history significant of dementia, COPD not on home oxygen supplementation, asthma comes in with son after she choked on some spaghetti they think.  Patient sometimes coughs  and chokes a lot when she is eating and drinking.  She does have COPD but does not require home oxygen supplementation.  After the choking episode and coughing she was very short of breath so they brought her the emergency department.  The emergency department she was found to be hypoxic was placed on 3 L of oxygen with good O2 sats.  She was weak wheezing diffusely.  Patient is being referred for admission for COPD exacerbation.  As I am walking in the room to do her evaluation she again clearly choked while swallowing 1 of her pills provided to her by the emergency room nurse.  No data recorded Assessment / Plan / Recommendation CHL IP CLINICAL IMPRESSIONS 02/19/2019 Clinical Impression Pt presents with mild pharyngeal dysphagia and suspected esophageal dysphagia; mild pharyngeal residue noted with all trials that was cleared with additional dry swallows (residue is likely age related change/normal difference), and towards the end of the study note penetration of thin liquids to the level of the cords that was noted to sit on the cords after swallowing- sensation was volume dependent. Pt does have a strong reflexive cough that was effective in clearing penetrates when cued to cough or with reflexive cough; one episode of trace aspiration of thin liquids was visualized and cleared by reflexive cough. Brief stasis of the barium tablet was noted in the pyriforms with Pt reporting "that didn't go down" and continuing to talk while pill sat in the pharyngeal space; Pill passed through the pharyngeal space with additional sips of thin liquids. Esophageal sweep reveals stasis of tablet in the medial/distal esophagus and to and fro movement of liquids in the esophagus however no retrograde movement was noted to or above the level of the cricopharyngus, however, no radiologist present to confirm esophageal observations. With multiple consecutive sips of thin liquids, bites of puree and sips of NTL, barium tablet was not  visualized passing through the esophagus. Unfortunately, despite Pt being pressed by heavy trials during this test she did not demonstrate a "choking episode" as she reportedly has frequently. Question if these episodes are related to possible esophageal component of swallowing. Recommend initiate D3/mech soft diet and thin liquids; Recommend meds to be adminsitered crushed with puree. Recommend precautions with PO: small bites/sips, alternate bites and sips, esophageal precautions including: small meals, when experiencing globus sensation Pt should do multiple dry swallows and take small bites of puree to facilitate clearance. Recommend consider GI consult. ST will continue to follow for diet tolerance and compliance with/implementation of compensatory strategies.  SLP Visit Diagnosis Dysphagia, unspecified (R13.10) Attention and concentration deficit following -- Frontal lobe  and executive function deficit following -- Impact on safety and function Mild aspiration risk;Moderate aspiration risk   CHL IP TREATMENT RECOMMENDATION 02/19/2019 Treatment Recommendations Therapy as outlined in treatment plan below   Prognosis 02/19/2019 Prognosis for Safe Diet Advancement Good Barriers to Reach Goals -- Barriers/Prognosis Comment -- CHL IP DIET RECOMMENDATION 02/19/2019 SLP Diet Recommendations Dysphagia 3 (Mech soft) solids;Thin liquid Liquid Administration via Cup;Straw Medication Administration Crushed with puree Compensations Minimize environmental distractions;Small sips/bites;Follow solids with liquid;Multiple dry swallows after each bite/sip;Slow rate Postural Changes Remain semi-upright after after feeds/meals (Comment);Seated upright at 90 degrees   CHL IP OTHER RECOMMENDATIONS 02/19/2019 Recommended Consults Consider GI evaluation Oral Care Recommendations Oral care BID Other Recommendations --   No flowsheet data found.  CHL IP FREQUENCY AND DURATION 02/19/2019 Speech Therapy Frequency (ACUTE ONLY) min 1 x/week  Treatment Duration 1 week      CHL IP ORAL PHASE 02/19/2019 Oral Phase WFL Oral - Pudding Teaspoon -- Oral - Pudding Cup -- Oral - Honey Teaspoon -- Oral - Honey Cup -- Oral - Nectar Teaspoon -- Oral - Nectar Cup -- Oral - Nectar Straw -- Oral - Thin Teaspoon -- Oral - Thin Cup -- Oral - Thin Straw -- Oral - Puree -- Oral - Mech Soft -- Oral - Regular -- Oral - Multi-Consistency -- Oral - Pill -- Oral Phase - Comment --  CHL IP PHARYNGEAL PHASE 02/19/2019 Pharyngeal Phase Impaired Pharyngeal- Pudding Teaspoon -- Pharyngeal -- Pharyngeal- Pudding Cup -- Pharyngeal -- Pharyngeal- Honey Teaspoon -- Pharyngeal -- Pharyngeal- Honey Cup -- Pharyngeal -- Pharyngeal- Nectar Teaspoon -- Pharyngeal -- Pharyngeal- Nectar Cup -- Pharyngeal -- Pharyngeal- Nectar Straw -- Pharyngeal -- Pharyngeal- Thin Teaspoon -- Pharyngeal -- Pharyngeal- Thin Cup Pharyngeal residue - valleculae;Pharyngeal residue - pyriform;Delayed swallow initiation-pyriform sinuses Pharyngeal -- Pharyngeal- Thin Straw Pharyngeal residue - valleculae;Pharyngeal residue - pyriform;Penetration/Aspiration during swallow;Penetration/Apiration after swallow Pharyngeal Material enters airway, passes BELOW cords then ejected out;Material enters airway, CONTACTS cords and then ejected out;Material enters airway, remains ABOVE vocal cords then ejected out Pharyngeal- Puree Pharyngeal residue - valleculae;Pharyngeal residue - pyriform Pharyngeal -- Pharyngeal- Mechanical Soft -- Pharyngeal -- Pharyngeal- Regular Pharyngeal residue - valleculae;Pharyngeal residue - pyriform Pharyngeal -- Pharyngeal- Multi-consistency -- Pharyngeal -- Pharyngeal- Pill Pharyngeal residue - pyriform Pharyngeal -- Pharyngeal Comment --  CHL IP CERVICAL ESOPHAGEAL PHASE 02/19/2019 Cervical Esophageal Phase Impaired Pudding Teaspoon -- Pudding Cup -- Honey Teaspoon -- Honey Cup -- Nectar Teaspoon -- Nectar Cup -- Nectar Straw -- Thin Teaspoon -- Thin Cup -- Thin Straw -- Puree -- Mechanical Soft  -- Regular -- Multi-consistency -- Pill -- Cervical Esophageal Comment -- Amelia H. Roddie Mc, CCC-SLP Speech Language Pathologist [name] 02/19/2019, 2:12 PM                   Scheduled Meds: . aspirin  81 mg Oral Daily  . ipratropium-albuterol  3 mL Nebulization Q6H  . methylPREDNISolone (SOLU-MEDROL) injection  60 mg Intravenous Q12H  . pantoprazole  40 mg Oral QAC breakfast  . sodium chloride flush  3 mL Intravenous Q12H   Continuous Infusions: . sodium chloride    . sodium chloride 50 mL/hr at 02/19/19 2100  . heparin 500 Units/hr (02/20/19 1602)  . piperacillin-tazobactam (ZOSYN)  IV 3.375 g (02/20/19 0932)     LOS: 2 days    Time spent: 47mins    Kathie Dike, MD [facility]   If 7PM-7AM, please contact night-coverage [email or web address]  02/20/2019, 5:24 PM

## 2019-02-20 NOTE — Progress Notes (Signed)
[facility] for Heparin  Indication: Elevated troponin   Allergies  Allergen Reactions  . Cephalexin     Unknown reaction  . Clarithromycin     Unknown reaction  . Penicillins     Did it involve swelling of the face/tongue/throat, SOB, or low BP? Unknown Did it involve sudden or severe rash/hives, skin peeling, or any reaction on the inside of your mouth or nose? Unknown Did you need to seek medical attention at a hospital or doctor's office? Unknownu  When did it last happen? If all above answers are "NO", may proceed with cephalosporin use.    . Sulfa Antibiotics     Unknown reaction    Patient Measurements: Height: 5\' 2"  (157.5 cm) Weight: 106 lb 7.7 oz (48.3 kg) IBW/kg (Calculated) : 50.1 Heparin Dosing Weight: HEPARIN DW (KG): 48.3  Vital Signs: Temp: 97.8 F (36.6 C) (02/28 2110) Temp Source: Oral (02/28 2110) BP: 121/47 (02/28 2110) Pulse Rate: 98 (02/28 2110)  Labs: Recent Labs    02/07/2019 2132  01/24/2019 2225 02/19/19 0030 02/19/19 0703 02/19/19 1234 02/19/19 1643 02/20/19 0238  HGB 16.3*  --   --   --  14.9  --   --  13.1  HCT 54.0*  --   --   --  48.5*  --   --  41.2  PLT 249  --   --   --  239  --   --  215  HEPARINUNFRC  --   --   --   --   --   --  0.86* 0.60  CREATININE  --   --  0.65  --  0.70  --   --   --   TROPONINI  --    < > 0.09* 1.13* 2.08* 2.24*  --   --    < > = values in this interval not displayed.    Estimated Creatinine Clearance: 38.5 mL/min (by C-G formula based on SCr of 0.7 mg/dL).   Medical History: Past Medical History:  Diagnosis Date  . Alzheimer's dementia ([place])   . Asthma   . Blindness of left eye   . Cancer ([place])    basal cell carncinoma  . COPD (chronic obstructive pulmonary disease) ([place])   . Depression   . Diverticulosis   . Dysphagia   . Glaucoma   . Hyperlipidemia   . Hypertension   . Insomnia   . Vitamin D deficiency      Assessment: Pharmacy consulted  to dose heparin  for this 85 yo female  with elevated  troponin I of 2.08 ng/mL.  She hasn't been on any prior anti-coagulants.  2/29 AM update: heparin level therapeutic x 1 after rate decrease, cardiology planning for 48 hours of heparin  Goal of Therapy:  Heparin level 0.3-0.7 units/ml Monitor platelets by anticoagulation protocol: Yes   Plan:  Cont heparin drip at 550 units/hr Confirmatory heparin level at [place], PharmD, [place] Pharmacist Phone: [phone]

## 2019-02-20 NOTE — Progress Notes (Signed)
[facility] for Heparin  Indication: Elevated troponin   Allergies  Allergen Reactions  . Cephalexin     Unknown reaction  . Clarithromycin     Unknown reaction  . Penicillins     Did it involve swelling of the face/tongue/throat, SOB, or low BP? Unknown Did it involve sudden or severe rash/hives, skin peeling, or any reaction on the inside of your mouth or nose? Unknown Did you need to seek medical attention at a hospital or doctor's office? Unknownu  When did it last happen? If all above answers are "NO", may proceed with cephalosporin use.    . Sulfa Antibiotics     Unknown reaction    Patient Measurements: Height: 5\' 2"  (157.5 cm) Weight: 106 lb 7.7 oz (48.3 kg) IBW/kg (Calculated) : 50.1 Heparin Dosing Weight: HEPARIN DW (KG): 48.3  Vital Signs: BP: 121/57 (02/29 0656) Pulse Rate: 84 (02/29 0656)  Labs: Recent Labs    02/03/2019 2132 01/30/2019 2225  02/19/19 0703 02/19/19 1234 02/19/19 1643 02/20/19 0238 02/20/19 0905 02/20/19 1147  HGB 16.3*  --   --  14.9  --   --  13.1  --   --   HCT 54.0*  --   --  48.5*  --   --  41.2  --   --   PLT 249  --   --  239  --   --  215  --   --   HEPARINUNFRC  --   --   --   --   --  0.86* 0.60  --  0.73*  CREATININE  --  0.65  --  0.70  --   --   --  0.62  --   TROPONINI  --  0.09*   < > 2.08* 2.24*  --   --  1.91*  --    < > = values in this interval not displayed.    Estimated Creatinine Clearance: 38.5 mL/min (by C-G formula based on SCr of 0.62 mg/dL).   Medical History: Past Medical History:  Diagnosis Date  . Alzheimer's dementia ([place])   . Asthma   . Blindness of left eye   . Cancer ([place])    basal cell carncinoma  . COPD (chronic obstructive pulmonary disease) ([place])   . Depression   . Diverticulosis   . Dysphagia   . Glaucoma   . Hyperlipidemia   . Hypertension   . Insomnia   . Vitamin D deficiency      Assessment: Pharmacy consulted to dose heparin  for  this 85 yo female  with elevated  troponin I of 2.08 ng/mL.  She hasn't been on any prior anti-coagulants.  2/29 1200 update: heparin level just slightly above  therapeutic goal range  after rate decrease, cardiology planning for 48 hours of heparin.  Goal of Therapy:  Heparin level 0.3-0.7 units/ml Monitor platelets by anticoagulation protocol: Yes   Plan:  Decrease  heparin drip to 500 units/hr Daily heparin level in am  Despina Pole, Pharm. D. Clinical Pharmacist 02/20/2019 12:41 PM

## 2019-02-20 NOTE — Consult Note (Signed)
Referring Provider: No ref. provider found Primary Care Physician:  System, Provider Not In Primary Gastroenterologist:  Barney Drain  Reason for Consultation:  DYSPHAGIA   Impression: ADMITTED WITH ASPIRATION PNEUMONITIS V. PNA AND PLACED ON ZOSYN/STEROIDS ASSOCIATED WITH ELEVATED CARDIAC ENZYMES. SPEECH EVALUATED AND PT OK FOR DYSPHAGIA 3 AND TLs.   Plan: 1. OK TO TRIAL DYSPHAGIA 3 DIET. 2. ADD PROTONIX DAILY TO PREVENT PUD/GASTRITIS.  3. PT IS NOT A CANDIDATE FOR SEDATION DUE TO ON GOING RESPIRTATORY/CARDIAC ISSUES. WILL PLAN EGD/?DILATION IN 4-6 WEEKS. DISCUSSED WITH SON.      HPI:  PT ADMITTED FROM HOME AFTER CHOKING EPISODE WHEN SHE TURNED BLUE. INITIAL VITALS IN ED O2 SAT 94%, CXR: COPD/NO ACUTE DISEASE. SPEECH EVAL FEB 28 SHOWS POSSIBLE WEB/STRUCTURE IN DISTAL ESOPHAGUS, NO RETROGRADE REPULSION. LIMITED DUE TO PT IS HARD OF HEARING/COGNITIVE DEFICITS.  PT DENIES FEVER, CHILLS, HEMATOCHEZIA, HEMATEMESIS, nausea, vomiting, melena, diarrhea, CHANGE IN BOWEL IN HABITS, constipation, abdominal pain, OR heartburn or indigestion.   Past Medical History:  Diagnosis Date  . Alzheimer's dementia ([place])   . Asthma   . Blindness of left eye   . Cancer ([place])    basal cell carncinoma  . COPD (chronic obstructive pulmonary disease) ([place])   . Depression   . Diverticulosis   . Dysphagia   . Glaucoma   . Hyperlipidemia   . Hypertension   . Insomnia   . Tumor, LARGE ANTERIOR PARAFALCINE(MENINGIOMA)   . Vitamin D deficiency    Past Surgical History:  Procedure Laterality Date  . ABDOMINAL HYSTERECTOMY    . CATARACT EXTRACTION    . CHOLECYSTECTOMY    . LUNG REMOVAL, PARTIAL Left 1960  . TOTAL HIP ARTHROPLASTY Left    Prior to Admission medications   Medication Sig Start Date End Date Taking? Authorizing Provider  albuterol (PROVENTIL) (2.5 MG/3ML) 0.083% nebulizer solution Take 1 ampule by nebulization 3 (three) times daily as needed for wheezing or shortness of breath.   09/10/18  Yes [provider]  amLODipine (NORVASC) 2.5 MG tablet Take 2.5 mg by mouth every evening.  03/25/18  Yes [provider]  atorvastatin (LIPITOR) 10 MG tablet Take 10 mg by mouth every evening.  03/25/18  Yes [provider]  montelukast (SINGULAIR) 10 MG tablet Take 10 mg by mouth every evening.  02/08/19  Yes [provider]  sertraline (ZOLOFT) 25 MG tablet Take 25 mg by mouth every evening.  12/25/18  Yes [provider]    Current Facility-Administered Medications  Medication Dose Route Frequency Provider    . 0.9 %  sodium chloride infusion  250 mL Intravenous PRN Derrill Kay A, MD    . 0.9 %  sodium chloride infusion   Intravenous Continuous Kathie Dike, MD    . albuterol (PROVENTIL) (2.5 MG/3ML) 0.083% nebulizer solution 2.5 mg  2.5 mg Nebulization TID PRN Phillips Grout, MD    . aspirin chewable tablet 81 mg  81 mg Oral Daily Dunn, Dayna N, PA-C    . heparin ADULT infusion 100 units/mL (25000 units/231mL sodium chloride 0.45%)  500 Units/hr Intravenous Continuous Memon, Jehanzeb, MD    . ipratropium-albuterol (DUONEB) 0.5-2.5 (3) MG/3ML nebulizer solution 3 mL  3 mL Nebulization Q6H David, Rachal A, MD    . methylPREDNISolone sodium succinate (SOLU-MEDROL) 125 mg/2 mL injection 60 mg  60 mg Intravenous Q12H Memon, Jolaine Artist, MD    . pantoprazole (PROTONIX) EC tablet 40 mg  40 mg Oral QAC breakfast Argie Lober, Marga Melnick, MD    .  piperacillin-tazobactam (ZOSYN) IVPB 3.375 g  3.375 g Intravenous Q8H Kathie Dike, MD     Allergies as of 02/12/2019 - Review Complete 02/06/2019  Allergen Reaction Noted  . Cephalexin  12/24/2017  . Clarithromycin  12/24/2017  . Penicillins  12/24/2017  . Sulfa antibiotics  12/24/2017   Family History  Problem Relation Age of Onset  . Cancer Mother   . Heart failure Mother   . Heart failure Father    Social History   Socioeconomic History  . Marital status: Single    Spouse name: Not on file  .  Number of children: Not on file  . Years of education: Not on file  . Highest education level: Not on file  Occupational History  . Not on file  Social Needs  . Financial resource strain: Patient refused  . Food insecurity:    Worry: Patient refused    Inability: Patient refused  . Transportation needs:    Medical: Patient refused    Non-medical: Patient refused  Tobacco Use  . Smoking status: Former Research scientist (life sciences)  . Smokeless tobacco: Never Used  Substance and Sexual Activity  . Alcohol use: Never    Frequency: Never  . Drug use: Never  . Sexual activity: Not Currently  Lifestyle  . Physical activity:    Days per week: Patient refused    Minutes per session: Patient refused  . Stress: Patient refused  Relationships  . Social connections:    Talks on phone: Patient refused    Gets together: Patient refused    Attends religious service: Patient refused    Active member of club or organization: Patient refused    Attends meetings of clubs or organizations: Patient refused    Relationship status: Patient refused  . Intimate partner violence:    Fear of current or ex partner: Patient refused    Emotionally abused: Patient refused    Physically abused: Patient refused    Forced sexual activity: Patient refused               Review of Systems: PER HPI OTHERWISE ALL SYSTEMS ARE NEGATIVE.  Vitals: Blood pressure (!) 121/57, pulse 84, temperature 97.8 F (36.6 C), temperature source Oral, resp. rate 18, height 5\' 2"  (1.575 m), weight 48.3 kg, SpO2 98 %.  Physical Exam: General:   Alert,  INTERACTIVE, pleasant and cooperative in NAD Head:  Normocephalic and atraumatic. Eyes:  Sclera clear, no icterus.   Conjunctiva pink. Mouth:  No lesions, dentition ABnormal. Neck:  Supple; no masses. Lungs:  Clear throughout to auscultation.   No wheezes. No acute distress. Heart:  Regular rate and IRREGULAR rhythm; no murmurs. Abdomen:  Soft, nontender and nondistended. No masses noted.  Normal bowel sounds, without guarding, and without rebound.   Msk:  Symmetrical without gross deformities. Normal posture. Extremities:  Without edema. Neurologic:  Alert and  INTERACTIVE, HARD OF HEARING, NO  NEW FOCAL DEFICITS  grossly normal neurologically. Cervical Nodes:  No significant cervical adenopathy. Psych:  Alert and cooperative. Normal mood and FLAT Affect.  Lab Results: Recent Labs    02/02/2019 2132 02/19/19 0703 02/20/19 0238  WBC 10.6* 15.5* 10.2  HGB 16.3* 14.9 13.1  HCT 54.0* 48.5* 41.2  PLT 249 239 215   BMET Recent Labs    02/19/19 0703 02/20/19 0905  NA 138 140  K 3.2* 3.5  CL 99 102  CO2 28 30  GLUCOSE 188* 129*  BUN 9 11  CREATININE 0.70 0.62  CALCIUM 8.3* 8.3*  LFT Recent Labs    02/16/2019 2225  PROT 7.4  ALBUMIN 4.1  AST 24  ALT 14  ALKPHOS 71  BILITOT 0.4     Studies/Results: ECHO FEB 28: EF 55-60%, APEX ABNL, pCXR: COPD   LOS: 2 days   Bilaal Leib  02/20/2019, 1:06 PM

## 2019-02-20 NOTE — Progress Notes (Signed)
Around [place] called me into the room because patient's son said patient was trying to get out of bed and was going home. Patient's son asked for something to sedate her. I tried to calm her down with deep breathing. When I left the room patient was breathing normal and talking. I was called back into room about 10 minutes later by the son. I paged Dr. Roderic Palau to ask for something for agitation. He prescribed Ativan. I left to get the Ativan. It was shift change.  I asked the respiratory therapist, Herbie [place], to please see Sheila Flores first. When I arrived to her room about 10 minutes later the patient was receiving a breathing treatment. She continued to [place] said that she needed to go on bipap. I paged Dr. Silas [place] and explained the situation. Patient was transferred and transported to ICU. I gave report to [place].

## 2019-02-21 ENCOUNTER — Telehealth: Payer: Self-pay | Admitting: Gastroenterology

## 2019-02-21 LAB — CBC
HCT: 44.1 % (ref 36.0–46.0)
Hemoglobin: 13.4 g/dL (ref 12.0–15.0)
MCH: 28.3 pg (ref 26.0–34.0)
MCHC: 30.4 g/dL (ref 30.0–36.0)
MCV: 93 fL (ref 80.0–100.0)
Platelets: 221 10*3/uL (ref 150–400)
RBC: 4.74 MIL/uL (ref 3.87–5.11)
RDW: 13.1 % (ref 11.5–15.5)
WBC: 20.2 10*3/uL — ABNORMAL HIGH (ref 4.0–10.5)
nRBC: 0 % (ref 0.0–0.2)

## 2019-02-21 LAB — MRSA PCR SCREENING: MRSA by PCR: POSITIVE — AB

## 2019-02-21 LAB — BLOOD GAS, ARTERIAL
ACID-BASE EXCESS: 7.1 mmol/L — AB (ref 0.0–2.0)
Bicarbonate: 28.7 mmol/L — ABNORMAL HIGH (ref 20.0–28.0)
FIO2: 55
Mode: POSITIVE
O2 SAT: 98.3 %
Patient temperature: 37
RATE: 25 resp/min
pCO2 arterial: 70.1 mmHg (ref 32.0–48.0)
pH, Arterial: 7.299 — ABNORMAL LOW (ref 7.350–7.450)
pO2, Arterial: 126 mmHg — ABNORMAL HIGH (ref 83.0–108.0)

## 2019-02-21 LAB — HEPARIN LEVEL (UNFRACTIONATED): Heparin Unfractionated: 0.21 IU/mL — ABNORMAL LOW (ref 0.30–0.70)

## 2019-02-21 MED ORDER — MORPHINE SULFATE (PF) 2 MG/ML IV SOLN
2.0000 mg | Freq: Once | INTRAVENOUS | Status: AC
  Administered 2019-02-21: 2 mg via INTRAVENOUS
  Filled 2019-02-21: qty 1

## 2019-02-21 MED ORDER — FUROSEMIDE 10 MG/ML IJ SOLN
40.0000 mg | Freq: Once | INTRAMUSCULAR | Status: AC
  Administered 2019-02-21: 40 mg via INTRAVENOUS
  Filled 2019-02-21: qty 4

## 2019-02-21 MED ORDER — ORAL CARE MOUTH RINSE
15.0000 mL | Freq: Two times a day (BID) | OROMUCOSAL | Status: DC
  Administered 2019-02-21 – 2019-02-27 (×9): 15 mL via OROMUCOSAL

## 2019-02-21 MED ORDER — POTASSIUM CHLORIDE 10 MEQ/100ML IV SOLN
10.0000 meq | INTRAVENOUS | Status: AC
  Administered 2019-02-21 (×3): 10 meq via INTRAVENOUS
  Filled 2019-02-21 (×2): qty 100

## 2019-02-21 MED ORDER — CHLORHEXIDINE GLUCONATE 0.12 % MT SOLN
15.0000 mL | Freq: Two times a day (BID) | OROMUCOSAL | Status: DC
  Administered 2019-02-21 – 2019-02-27 (×9): 15 mL via OROMUCOSAL
  Filled 2019-02-21 (×8): qty 15

## 2019-02-21 MED ORDER — METHYLPREDNISOLONE SODIUM SUCC 125 MG IJ SOLR
60.0000 mg | Freq: Four times a day (QID) | INTRAMUSCULAR | Status: DC
  Administered 2019-02-21 – 2019-02-22 (×3): 60 mg via INTRAVENOUS
  Filled 2019-02-21 (×3): qty 2

## 2019-02-21 NOTE — Progress Notes (Signed)
[facility] for Heparin  Indication: Elevated troponin   Allergies  Allergen Reactions  . Cephalexin     Unknown reaction  . Clarithromycin     Unknown reaction  . Penicillins     Did it involve swelling of the face/tongue/throat, SOB, or low BP? Unknown Did it involve sudden or severe rash/hives, skin peeling, or any reaction on the inside of your mouth or nose? Unknown Did you need to seek medical attention at a hospital or doctor's office? Unknownu  When did it last happen? If all above answers are "NO", may proceed with cephalosporin use.    . Sulfa Antibiotics     Unknown reaction    Patient Measurements: Height: 5\' 2"  (157.5 cm) Weight: 106 lb 7.7 oz (48.3 kg) IBW/kg (Calculated) : 50.1 Heparin Dosing Weight: HEPARIN DW (KG): 48.3  Vital Signs: Temp: 97.4 F (36.3 C) (03/01 0758) Temp Source: Axillary (03/01 0758) BP: 138/68 (03/01 0500) Pulse Rate: 87 (03/01 0758)  Labs: Recent Labs    02/09/2019 2225  02/19/19 0703 02/19/19 1234  02/20/19 0238 02/20/19 0905 02/20/19 1147 02/21/19 0425 02/21/19 0426  HGB  --   --  14.9  --   --  13.1  --   --  13.4  --   HCT  --   --  48.5*  --   --  41.2  --   --  44.1  --   PLT  --   --  239  --   --  215  --   --  221  --   HEPARINUNFRC  --   --   --   --    < > 0.60  --  0.73*  --  0.21*  CREATININE 0.65  --  0.70  --   --   --  0.62  --   --   --   TROPONINI 0.09*   < > 2.08* 2.24*  --   --  1.91*  --   --   --    < > = values in this interval not displayed.    Estimated Creatinine Clearance: 38.5 mL/min (by C-G formula based on SCr of 0.62 mg/dL).   Medical History: Past Medical History:  Diagnosis Date  . Alzheimer's dementia ([place])   . Asthma   . Blindness of left eye   . Cancer ([place])    basal cell carncinoma  . COPD (chronic obstructive pulmonary disease) ([place])   . Depression   . Diverticulosis   . Dysphagia   . Glaucoma   . Hyperlipidemia   . Hypertension    . Insomnia   . Tumor, LARGE ANTERIOR PARAFALCINE(MENINGIOMA)   . Vitamin D deficiency      Assessment: Pharmacy consulted to dose heparin  for this 85 yo female  with elevated  troponin I of 2.08 ng/mL.  She hasn't been on any prior anti-coagulants.  3/1 am update: heparin level of 0.21 IU/mL just slightly below  therapeutic goal range  after rate decrease.  Goal of Therapy:  Heparin level 0.3-0.7 units/ml Monitor platelets by anticoagulation protocol: Yes   Plan:  Increase crease  heparin drip to 550 units/hr Daily heparin level in am  Despina Pole, Pharm. D. Clinical Pharmacist 02/21/2019 8:53 AM

## 2019-02-21 NOTE — Progress Notes (Addendum)
  Assessment/Plan: Admitted with HYPOXIA DUE TO ?ASPIRATION V. COPD EXACERBATION. O2 SATS DROPPED & ABG CONSISTENT WITH HYPERCARBIC RESPIRATORY FAILURE & PT TRANSFERRED TO ICU. CLINICALLY IMPROVED. TOLERATING A FULL LIQUID DIET.  PLAN: 1. FULL LIQUID DIET ADVANCE AS TOLERATED. 2. CONTINUE PROTONIX DAILY. 3. NO INDICATION FOR ENDOSCOPY AT THIS TIME. 4.  Will SIGN OFF. PLEASE CALL WITH QUESTIONS.   Subjective: Since I last evaluated the patient [facility] ICU AFTER C/O SEVER SOB/MS CHANGES. EPISODE NOT ASSOCIATED WITH EATING. NO NAUSEA, VOMITING, OR ABDOMINAL PAIN.  Objective: Vital signs in last 24 hours: Vitals:   02/21/19 0843 02/21/19 0925  BP:    Pulse:    Resp:  (!) 34  Temp:    SpO2: 100% 99%   General appearance: alert, cooperative and no distress Resp: clear to auscultation bilaterally Cardio: regular rate and rhythm GI: soft, non-tender; bowel sounds normal;  Lab Results:  pCO2 77-87 WBC 10-->15-->20 WHILE ON STEROIDS   Studies/Results: Dg Chest Port 1 View  Result Date: 02/20/2019 CLINICAL DATA:  Per RN- Resp failure EXAM: PORTABLE CHEST 1 VIEW COMPARISON:  02/19/2019 FINDINGS: The heart is enlarged but partially obscured by parenchymal opacities in the lung bases. There are patchy airspace filling opacities bilaterally, significantly increased compared with recent exam. There is a basilar predominance with partial obscuration of the hemidiaphragms. IMPRESSION: Significant interval increase in bilateral airspace filling opacities, consistent pulmonary edema or infectious process. Cardiomegaly. Electronically Signed   By: Nolon Nations M.D.   On: 02/20/2019 20:31    Medications: I have reviewed the patient's current medications.

## 2019-02-21 NOTE — Progress Notes (Signed)
Patient is off BiPAP on 4 lpm nasal cannula. So far appears to be ok. She has dementia? And is very hard of hearing. She does not appear to be struggling as last night. BiPAP on stand by. She also has some skin break down on nose. She is a very pleasant elderly woman.

## 2019-02-21 NOTE — Telephone Encounter (Signed)
OPV IN 4-6 WEEKS , DX: DYSPHAGIA W/ SLF OR AN APP.

## 2019-02-21 NOTE — Progress Notes (Signed)
PROGRESS NOTE    Sheila Flores  IOX:[id number] DOB: 1930/07/06 DOA: 01/26/2019 PCP: System, Provider Not In    Brief Narrative:  85 year old female brought to the hospital after having a choking episode.  She has a history of COPD, hypertension.  She was noted to be short of breath and wheezing.  She was seen by speech therapy and underwent modified barium swallow.  She was also seen by GI for possible esophageal component of dysphasia.  She is currently on a full liquid diet.  She also had elevation of troponin felt to be related to demand ischemia.  Currently on treatment with intravenous heparin.  GI and cardiology following.  Assessment & Plan:   Principal Problem:   COPD exacerbation ([place]) Active Problems:   Alzheimer's dementia ([place])   Hypertension   Hyperlipidemia   Hypokalemia   Choking episode   Elevated troponin   Dysphagia   Aspiration into airway  1. Acute respiratory failure with hypoxia and hypercarbia  - Pt had acute decompensation and now is on bipap. She was also noted to have pulmonary edema.  IV lasix ordered. Follow chest xray and wean bipap as able.  I feel that a palliative medicine consult is in order for goals of care discussion after my conversation with son at bedside.   2. Dysphagia with aspiration.  Seen by speech therapy and underwent modified barium swallow study.  It was felt that her aspiration may be more related to esophageal causes.  She was seen by GI not felt to be a candidate for EGD at this time due to cardiac/respiratory issues.  She was started on a diet by speech therapy, but continues to have coughing/shortness of breath with p.o. intake.  Will change diet to full liquids for now continue to monitor. 3. COPD exacerbation.  Wheezing precipitated by aspiration.  Continue on antibiotics and bronchodilators as well as steroids.  Increase IV solumedrol to every 6 hours.   4. Elevated troponin, felt to be secondary to demand ischemia.   Troponin has  peaked at 2.24.  Echocardiogram shows normal ejection fraction, although does comment on hypokinetic apex.  Cardiology following.  Currently on medical management with intravenous heparin and aspirin. 5. Hypokalemia.  Replace as needed. Follow lytes.   6. Hypertension.  Blood pressure currently stable.  Continue to monitor. 7. Right basal frontal meningioma.  Follow[facility] at [facility] neurosurgery clinic.  DVT prophylaxis: Heparin infusion Code Status: Full code Family Communication: Discussed with son and updated at the bedside Disposition Plan: continue care in ICU due to [place] for decompensation  Consultants:   Cardiology  GI  Procedures:  EGD: 1. Stage 1: 1: Entire apex is abnormal.  2. The left ventricle has normal systolic function with an ejection fraction of 60-65%. The cavity size was normal. There is moderate asymmetric left ventricular hypertrophy. Left ventricular diastolic Doppler parameters are consistent with impaired  relaxation Elevated left atrial and left ventricular end-diastolic pressures.  3. The right ventricle has normal systolic function. The cavity was normal. There is no increase in right ventricular wall thickness.  4. Left atrial size was severely dilated.  5. The mitral valve is normal in structure. There is mild mitral annular calcification present. Mitral valve regurgitation is moderate by color flow Doppler.  6. The tricuspid valve is normal in structure.  7. The aortic valve is tricuspid. There is mild stenosis of the aortic valve. Aortic valve regurgitation is mild by color flow Doppler.   8. The aortic root is  normal in size and structure.  Antimicrobials:   Zosyn 2/28>>  Subjective: Pt had acute respiratory distress overnight and now on bipap.  Unable to get off bipap today.   Objective: Vitals:   02/21/19 0758 02/21/19 0843 02/21/19 0925 02/21/19 1125  BP:      Pulse: 87   89  Resp: (!) 24  (!) 34 (!) 43  Temp: (!) 97.4 F (36.3 C)   (!)  97.1 F (36.2 C)  TempSrc: Axillary   Axillary  SpO2: 98% 100% 99% 100%  Weight:      Height:        Intake/Output Summary (Last 24 hours) at 02/21/2019 1204 Last data filed at 02/21/2019 1020 Gross per 24 hour  Intake 793 ml  Output 1300 ml  Net -507 ml   Filed Weights   02/15/2019 2130 02/19/19 1558  Weight: 55 kg 48.3 kg    Examination:  General exam: Pt on bipap.   Respiratory system: shallow BS bilateral with bibasilar wheezes. Poor air movement. On bipap.  Cardiovascular system: normal S1 & S2 heard. No JVD, murmurs, rubs, gallops or clicks. No pedal edema. Gastrointestinal system: Abdomen is nondistended, soft and nontender. No organomegaly or masses felt. Normal bowel sounds heard. Central nervous system:  No focal neurological deficits. Extremities: Symmetric 5 x 5 power. Skin: No rashes, lesions or ulcers Psychiatry: Judgement and insight appear normal. Mood & affect appropriate.   Data Reviewed: I have personally reviewed following labs and imaging studies  CBC: Recent Labs  Lab 02/09/2019 2132 02/19/19 0703 02/20/19 0238 02/21/19 0425  WBC 10.6* 15.5* 10.2 20.2*  NEUTROABS 7.5  --   --   --   HGB 16.3* 14.9 13.1 13.4  HCT 54.0* 48.5* 41.2 44.1  MCV 92.6 92.7 91.2 93.0  PLT 249 239 215 450   Basic Metabolic Panel: Recent Labs  Lab 02/01/2019 2225 02/19/19 0703 02/20/19 0905  NA 136 138 140  K 2.6* 3.2* 3.5  CL 94* 99 102  CO2 31 28 30   GLUCOSE 186* 188* 129*  BUN 7* 9 11  CREATININE 0.65 0.70 0.62  CALCIUM 8.8* 8.3* 8.3*   GFR: Estimated Creatinine Clearance: 38.5 mL/min (by C-G formula based on SCr of 0.62 mg/dL). Liver Function Tests: Recent Labs  Lab 02/08/2019 2225  AST 24  ALT 14  ALKPHOS 71  BILITOT 0.4  PROT 7.4  ALBUMIN 4.1   No results for input(s): LIPASE, AMYLASE in the last 168 hours. No results for input(s): AMMONIA in the last 168 hours. Coagulation Profile: No results for input(s): INR, PROTIME in the last 168 hours. Cardiac  Enzymes: Recent Labs  Lab 02/06/2019 2225 02/19/19 0030 02/19/19 0703 02/19/19 1234 02/20/19 0905  TROPONINI 0.09* 1.13* 2.08* 2.24* 1.91*   BNP (last 3 results) No results for input(s): PROBNP in the last 8760 hours. HbA1C: No results for input(s): HGBA1C in the last 72 hours. CBG: Recent Labs  Lab 02/15/2019 2357  GLUCAP 150*   Lipid Profile: No results for input(s): CHOL, HDL, LDLCALC, TRIG, CHOLHDL, LDLDIRECT in the last 72 hours. Thyroid Function Tests: No results for input(s): TSH, T4TOTAL, FREET4, T3FREE, THYROIDAB in the last 72 hours. Anemia Panel: No results for input(s): VITAMINB12, FOLATE, FERRITIN, TIBC, IRON, RETICCTPCT in the last 72 hours. Sepsis Labs: No results for input(s): PROCALCITON, LATICACIDVEN in the last 168 hours.  Recent Results (from the past 240 hour(s))  MRSA PCR Screening     Status: Abnormal   Collection Time: 02/20/19  8:30 PM  Result Value Ref Range Status   MRSA by PCR POSITIVE (A) NEGATIVE Final    Comment:        The GeneXpert MRSA Assay (FDA approved for NASAL specimens only), is one component of a comprehensive MRSA colonization surveillance program. It is not intended to diagnose MRSA infection nor to guide or monitor treatment for MRSA infections. RESULT CALLED TO, READ BACK BY AND VERIFIED WITH: LANDER AT [phone] ON 0942 BY MOSLEY,J Performed at [facility], [address] [place], [place] [address]     Radiology Studies: Dg Chest [facility] 1 View  Result Date: 02/20/2019 CLINICAL DATA:  Per RN- Resp failure EXAM: PORTABLE CHEST 1 VIEW COMPARISON:  02/03/2019 FINDINGS: The heart is enlarged but partially obscured by parenchymal opacities in the lung bases. There are patchy airspace filling opacities bilaterally, significantly increased compared with recent exam. There is a basilar predominance with partial obscuration of the hemidiaphragms. IMPRESSION: Significant interval increase in bilateral airspace filling opacities, consistent  pulmonary edema or infectious process. Cardiomegaly. Electronically Signed   By: Nolon Nations M.D.   On: 02/20/2019 20:31   Dg Swallowing Func-speech Pathology  Result Date: 02/19/2019 Objective Swallowing Evaluation: Type of Study: MBS-Modified Barium Swallow Study  Patient Details Name: Sheila Flores MRN: [id number] Date of Birth: January 31, 1930 Today's Date: 02/19/2019 Time: SLP Start Time (ACUTE ONLY): 1157 -SLP Stop Time (ACUTE ONLY): 1223 SLP Time Calculation (min) (ACUTE ONLY): 26 min Past Medical History: Past Medical History: Diagnosis Date . Alzheimer's dementia ([place])  . Asthma  . Blindness of left eye  . Cancer ([place])   basal cell carncinoma . COPD (chronic obstructive pulmonary disease) ([place])  . Depression  . Diverticulosis  . Dysphagia  . Glaucoma  . Hyperlipidemia  . Hypertension  . Insomnia  . Vitamin D deficiency  Past Surgical History: Past Surgical History: Procedure Laterality Date . ABDOMINAL HYSTERECTOMY   . CATARACT EXTRACTION   . CHOLECYSTECTOMY   . LUNG REMOVAL, PARTIAL Left 1960 . TOTAL HIP ARTHROPLASTY Left  HPI: Lavana Huckeba is a 85 y.o. female with medical history significant of dementia, COPD not on home oxygen supplementation, asthma comes in with son after she choked on some spaghetti they think.  Patient sometimes coughs and chokes a lot when she is eating and drinking.  She does have COPD but does not require home oxygen supplementation.  After the choking episode and coughing she was very short of breath so they brought her the emergency department.  The emergency department she was found to be hypoxic was placed on 3 L of oxygen with good O2 sats.  She was weak wheezing diffusely.  Patient is being referred for admission for COPD exacerbation.  As I am walking in the room to do her evaluation she again clearly choked while swallowing 1 of her pills provided to her by the emergency room nurse.  No data recorded Assessment / Plan / Recommendation CHL IP CLINICAL IMPRESSIONS  02/19/2019 Clinical Impression Pt presents with mild pharyngeal dysphagia and suspected esophageal dysphagia; mild pharyngeal residue noted with all trials that was cleared with additional dry swallows (residue is likely age related change/normal difference), and towards the end of the study note penetration of thin liquids to the level of the cords that was noted to sit on the cords after swallowing- sensation was volume dependent. Pt does have a strong reflexive cough that was effective in clearing penetrates when cued to cough or with reflexive cough; one episode of trace aspiration of thin liquids was  visualized and cleared by reflexive cough. Brief stasis of the barium tablet was noted in the pyriforms with Pt reporting "that didn't go down" and continuing to talk while pill sat in the pharyngeal space; Pill passed through the pharyngeal space with additional sips of thin liquids. Esophageal sweep reveals stasis of tablet in the medial/distal esophagus and to and fro movement of liquids in the esophagus however no retrograde movement was noted to or above the level of the cricopharyngus, however, no radiologist present to confirm esophageal observations. With multiple consecutive sips of thin liquids, bites of puree and sips of NTL, barium tablet was not visualized passing through the esophagus. Unfortunately, despite Pt being pressed by heavy trials during this test she did not demonstrate a "choking episode" as she reportedly has frequently. Question if these episodes are related to possible esophageal component of swallowing. Recommend initiate D3/mech soft diet and thin liquids; Recommend meds to be adminsitered crushed with puree. Recommend precautions with PO: small bites/sips, alternate bites and sips, esophageal precautions including: small meals, when experiencing globus sensation Pt should do multiple dry swallows and take small bites of puree to facilitate clearance. Recommend consider GI consult. ST  will continue to follow for diet tolerance and compliance with/implementation of compensatory strategies.  SLP Visit Diagnosis Dysphagia, unspecified (R13.10) Attention and concentration deficit following -- Frontal lobe and executive function deficit following -- Impact on safety and function Mild aspiration risk;Moderate aspiration risk   CHL IP TREATMENT RECOMMENDATION 02/19/2019 Treatment Recommendations Therapy as outlined in treatment plan below   Prognosis 02/19/2019 Prognosis for Safe Diet Advancement Good Barriers to Reach Goals -- Barriers/Prognosis Comment -- CHL IP DIET RECOMMENDATION 02/19/2019 SLP Diet Recommendations Dysphagia 3 (Mech soft) solids;Thin liquid Liquid Administration via Cup;Straw Medication Administration Crushed with puree Compensations Minimize environmental distractions;Small sips/bites;Follow solids with liquid;Multiple dry swallows after each bite/sip;Slow rate Postural Changes Remain semi-upright after after feeds/meals (Comment);Seated upright at 90 degrees   CHL IP OTHER RECOMMENDATIONS 02/19/2019 Recommended Consults Consider GI evaluation Oral Care Recommendations Oral care BID Other Recommendations --   No flowsheet data found.  CHL IP FREQUENCY AND DURATION 02/19/2019 Speech Therapy Frequency (ACUTE ONLY) min 1 x/week Treatment Duration 1 week      CHL IP ORAL PHASE 02/19/2019 Oral Phase WFL Oral - Pudding Teaspoon -- Oral - Pudding Cup -- Oral - Honey Teaspoon -- Oral - Honey Cup -- Oral - Nectar Teaspoon -- Oral - Nectar Cup -- Oral - Nectar Straw -- Oral - Thin Teaspoon -- Oral - Thin Cup -- Oral - Thin Straw -- Oral - Puree -- Oral - Mech Soft -- Oral - Regular -- Oral - Multi-Consistency -- Oral - Pill -- Oral Phase - Comment --  CHL IP PHARYNGEAL PHASE 02/19/2019 Pharyngeal Phase Impaired Pharyngeal- Pudding Teaspoon -- Pharyngeal -- Pharyngeal- Pudding Cup -- Pharyngeal -- Pharyngeal- Honey Teaspoon -- Pharyngeal -- Pharyngeal- Honey Cup -- Pharyngeal -- Pharyngeal- Nectar  Teaspoon -- Pharyngeal -- Pharyngeal- Nectar Cup -- Pharyngeal -- Pharyngeal- Nectar Straw -- Pharyngeal -- Pharyngeal- Thin Teaspoon -- Pharyngeal -- Pharyngeal- Thin Cup Pharyngeal residue - valleculae;Pharyngeal residue - pyriform;Delayed swallow initiation-pyriform sinuses Pharyngeal -- Pharyngeal- Thin Straw Pharyngeal residue - valleculae;Pharyngeal residue - pyriform;Penetration/Aspiration during swallow;Penetration/Apiration after swallow Pharyngeal Material enters airway, passes BELOW cords then ejected out;Material enters airway, CONTACTS cords and then ejected out;Material enters airway, remains ABOVE vocal cords then ejected out Pharyngeal- Puree Pharyngeal residue - valleculae;Pharyngeal residue - pyriform Pharyngeal -- Pharyngeal- Mechanical Soft -- Pharyngeal -- Pharyngeal- Regular Pharyngeal residue -  valleculae;Pharyngeal residue - pyriform Pharyngeal -- Pharyngeal- Multi-consistency -- Pharyngeal -- Pharyngeal- Pill Pharyngeal residue - pyriform Pharyngeal -- Pharyngeal Comment --  CHL IP CERVICAL ESOPHAGEAL PHASE 02/19/2019 Cervical Esophageal Phase Impaired Pudding Teaspoon -- Pudding Cup -- Honey Teaspoon -- Honey Cup -- Nectar Teaspoon -- Nectar Cup -- Nectar Straw -- Thin Teaspoon -- Thin Cup -- Thin Straw -- Puree -- Mechanical Soft -- Regular -- Multi-consistency -- Pill -- Cervical Esophageal Comment -- Amelia H. Roddie Mc, CCC-SLP Speech Language Pathologist [name] 02/19/2019, 2:12 PM              Scheduled Meds: . aspirin  81 mg Oral Daily  . chlorhexidine  15 mL Mouth Rinse BID  . ipratropium-albuterol  3 mL Nebulization Q6H  . mouth rinse  15 mL Mouth Rinse q12n4p  . methylPREDNISolone (SOLU-MEDROL) injection  60 mg Intravenous Q12H  . pantoprazole  40 mg Oral QAC breakfast  . sodium chloride flush  3 mL Intravenous Q12H   Continuous Infusions: . sodium chloride    . sodium chloride 50 mL/hr at 02/19/19 2100  . heparin 550 Units/hr (02/21/19 0857)  .  piperacillin-tazobactam (ZOSYN)  IV 3.375 g (02/21/19 0111)     LOS: 3 days   Critical Care Time spent: 59 mins  Quill Grinder Wynetta Emery, MD [facility] How to contact the [facility] Attending or Consulting provider [place] or covering provider during after hours [place], for this patient?  1. Check the care team in [facility] and look for a) attending/consulting [facility] provider listed and b) the [facility] team listed 2. Log into [email or web address] and use [place]'s universal password to access. If you do not have the password, please contact the hospital operator. 3. Locate the [facility] provider you are looking for under [facility] and page to a number that you can be directly reached. 4. If you still have difficulty reaching the provider, please page the [facility] (Director on Call) for the Hospitalists listed on amion for assistance.  If 7PM-7AM, please contact night-coverage [email or web address]  02/21/2019, 12:04 PM

## 2019-02-21 NOTE — Progress Notes (Signed)
Patient is off BiPAP On 5 lpm

## 2019-02-21 NOTE — Progress Notes (Signed)
Dr. Shanon Brow notified of PC02 of 70.1 on BiPAP.

## 2019-02-21 DEATH — deceased

## 2019-02-22 ENCOUNTER — Encounter: Payer: Self-pay | Admitting: Nurse Practitioner

## 2019-02-22 ENCOUNTER — Encounter (HOSPITAL_COMMUNITY): Payer: Self-pay | Admitting: Primary Care

## 2019-02-22 ENCOUNTER — Inpatient Hospital Stay (HOSPITAL_COMMUNITY): Payer: Medicare HMO

## 2019-02-22 DIAGNOSIS — Z515 Encounter for palliative care: Secondary | ICD-10-CM

## 2019-02-22 DIAGNOSIS — Z7189 Other specified counseling: Secondary | ICD-10-CM

## 2019-02-22 DIAGNOSIS — R0600 Dyspnea, unspecified: Secondary | ICD-10-CM

## 2019-02-22 LAB — BASIC METABOLIC PANEL
Anion gap: 8 (ref 5–15)
BUN: 12 mg/dL (ref 8–23)
CO2: 34 mmol/L — ABNORMAL HIGH (ref 22–32)
Calcium: 8.3 mg/dL — ABNORMAL LOW (ref 8.9–10.3)
Chloride: 98 mmol/L (ref 98–111)
Creatinine, Ser: 0.6 mg/dL (ref 0.44–1.00)
GFR calc Af Amer: >60 mL/min (ref >60)
GFR calc non Af Amer: >60 mL/min (ref >60)
Glucose, Bld: 164 mg/dL — ABNORMAL HIGH (ref 70–99)
Potassium: 3.6 mmol/L (ref 3.5–5.1)
Sodium: 140 mmol/L (ref 135–145)

## 2019-02-22 LAB — CBC
HCT: 45.5 % (ref 36.0–46.0)
Hemoglobin: 14.5 g/dL (ref 12.0–15.0)
MCH: 28.7 pg (ref 26.0–34.0)
MCHC: 31.9 g/dL (ref 30.0–36.0)
MCV: 89.9 fL (ref 80.0–100.0)
PLATELETS: 172 10*3/uL (ref 150–400)
RBC: 5.06 MIL/uL (ref 3.87–5.11)
RDW: 13.1 % (ref 11.5–15.5)
WBC: 9.6 10*3/uL (ref 4.0–10.5)
nRBC: 0 % (ref 0.0–0.2)

## 2019-02-22 LAB — HEPARIN LEVEL (UNFRACTIONATED)
Heparin Unfractionated: 0.25 IU/mL — ABNORMAL LOW (ref 0.30–0.70)
Heparin Unfractionated: 0.44 IU/mL (ref 0.30–0.70)

## 2019-02-22 LAB — BRAIN NATRIURETIC PEPTIDE: B NATRIURETIC PEPTIDE 5: 544 pg/mL — AB (ref 0.0–100.0)

## 2019-02-22 MED ORDER — METHYLPREDNISOLONE SODIUM SUCC 125 MG IJ SOLR
60.0000 mg | Freq: Two times a day (BID) | INTRAMUSCULAR | Status: DC
  Administered 2019-02-22 – 2019-02-24 (×4): 60 mg via INTRAVENOUS
  Filled 2019-02-22 (×4): qty 2

## 2019-02-22 NOTE — Progress Notes (Signed)
[place] for Heparin  Indication: Elevated troponin   Allergies  Allergen Reactions  . Cephalexin     Unknown reaction  . Clarithromycin     Unknown reaction  . Penicillins     Did it involve swelling of the face/tongue/throat, SOB, or low BP? Unknown Did it involve sudden or severe rash/hives, skin peeling, or any reaction on the inside of your mouth or nose? Unknown Did you need to seek medical attention at a hospital or doctor's office? Unknownu  When did it last happen? If all above answers are "NO", may proceed with cephalosporin use.    . Sulfa Antibiotics     Unknown reaction    Patient Measurements: Height: 5\' 2"  (157.5 cm) Weight: 104 lb 15 oz (47.6 kg) IBW/kg (Calculated) : 50.1 Heparin Dosing Weight: HEPARIN DW (KG): 48.3  Vital Signs: Temp: 97.8 F (36.6 C) (03/02 1638) Temp Source: Axillary (03/02 1638) BP: 131/75 (03/02 1800) Pulse Rate: 115 (03/02 1800)  Labs: Recent Labs    02/20/19 0238 02/20/19 0905  02/21/19 0425 02/21/19 0426 02/22/19 0440 02/22/19 0441 02/22/19 0850 02/22/19 1727  HGB 13.1  --   --  13.4  --   --  14.5  --   --   HCT 41.2  --   --  44.1  --   --  45.5  --   --   PLT 215  --   --  221  --   --  172  --   --   HEPARINUNFRC 0.60  --    < >  --  0.21* 0.25*  --   --  0.44  CREATININE  --  0.62  --   --   --   --   --  0.60  --   TROPONINI  --  1.91*  --   --   --   --   --   --   --    < > = values in this interval not displayed.    Estimated Creatinine Clearance: 37.9 mL/min (by C-G formula based on SCr of 0.6 mg/dL).   Medical History: Past Medical History:  Diagnosis Date  . Alzheimer's dementia ([place])   . Asthma   . Blindness of left eye   . Cancer ([place])    basal cell carncinoma  . COPD (chronic obstructive pulmonary disease) ([place])   . Depression   . Diverticulosis   . Dysphagia   . Glaucoma   . Hyperlipidemia   . Hypertension   . Insomnia   . Tumor, LARGE  ANTERIOR PARAFALCINE(MENINGIOMA)   . Vitamin D deficiency      Assessment: Pharmacy consulted to dose heparin  for this 85 yo female  with elevated troponin  Heparin level: 0.44  Goal of Therapy:  Heparin level 0.3-0.7 units/ml Monitor platelets by anticoagulation protocol: Yes   Plan:  Continue heparin drip at 650 units/hr Heparin level daily.  Margot Ables, PharmD Clinical Pharmacist 02/22/2019 8:34 PM

## 2019-02-22 NOTE — Plan of Care (Signed)
Discussed with patient plan of care for the evening, pain management and need to keep the Bipap on with desaturation of oxygen stats with some teach back displayed

## 2019-02-22 NOTE — Progress Notes (Signed)
PROGRESS NOTE    Sheila Flores  ZSW:[id number] DOB: 1930/08/02 DOA: 02/15/2019 PCP: System, Provider Not In    Brief Narrative:  85 year old female brought to the hospital after having a choking episode.  She has a history of COPD, hypertension.  She was noted to be short of breath and wheezing.  She was seen by speech therapy and underwent modified barium swallow.  She was also seen by GI for possible esophageal component of dysphasia.  She is currently on a full liquid diet.  She also had elevation of troponin felt to be related to demand ischemia.  Currently on treatment with intravenous heparin.  GI and cardiology following.  Assessment & Plan:   Principal Problem:   COPD exacerbation ([place]) Active Problems:   Alzheimer's dementia ([place])   Hypertension   Hyperlipidemia   Hypokalemia   Choking episode   Elevated troponin   Dysphagia   Aspiration into airway  1. Acute respiratory failure with hypoxia and hypercarbia  - Pt had acute decompensation and now is on bipap. She was also noted to have pulmonary edema.  IV lasix ordered. Follow chest xray and wean bipap as able.  I feel that a palliative medicine consult is in order for goals of care discussion after my conversation with son at bedside. 2. Acute pulmonary edema - Pt has diuresed nearly 2.5 L after IV lasix given 3/1.  CXR this am shows some improvement.    3. Dysphagia with aspiration.  Seen by speech therapy and underwent modified barium swallow study.  It was felt that her aspiration may be more related to esophageal causes.  She was seen by GI not felt to be a candidate for EGD at this time due to cardiac/respiratory issues.  She was started on a diet by speech therapy, but continues to have coughing/shortness of breath with p.o. intake.  Will change diet to full liquids for now continue to monitor. 4. COPD exacerbation.  Wheezing precipitated by aspiration.  Continue on antibiotics and bronchodilators as well as steroids.     5. Elevated troponin, felt to be secondary to demand ischemia.   Troponin has peaked at 2.24.  Echocardiogram shows normal ejection fraction, although does comment on hypokinetic apex.  Cardiology following.  Currently on medical management with intravenous heparin and aspirin. 6. Hypokalemia.  Replace as needed. Follow lytes.   7. Hypertension.  Blood pressure currently stable.  Continue to monitor. 8. Right basal frontal meningioma.  Followed at [facility] neurosurgery clinic.  DVT prophylaxis: Heparin infusion Code Status: Full code Family Communication: Discussed with son and updated at the bedside Disposition Plan: continue care in ICU due to [place] for decompensation  Consultants:   Cardiology  GI  Procedures:  EGD: 1. Stage 1: 1: Entire apex is abnormal.  2. The left ventricle has normal systolic function with an ejection fraction of 60-65%. The cavity size was normal. There is moderate asymmetric left ventricular hypertrophy. Left ventricular diastolic Doppler parameters are consistent with impaired  relaxation Elevated left atrial and left ventricular end-diastolic pressures.  3. The right ventricle has normal systolic function. The cavity was normal. There is no increase in right ventricular wall thickness.  4. Left atrial size was severely dilated.  5. The mitral valve is normal in structure. There is mild mitral annular calcification present. Mitral valve regurgitation is moderate by color flow Doppler.  6. The tricuspid valve is normal in structure.  7. The aortic valve is tricuspid. There is mild stenosis of the aortic  valve. Aortic valve regurgitation is mild by color flow Doppler.   8. The aortic root is normal in size and structure.  Antimicrobials:   Zosyn 2/28>>  Subjective: Pt was able to come off bipap for some time yesterday but is back on it now.  She is lethargic this morning.    Objective: Vitals:   02/22/19 0334 02/22/19 0400 02/22/19 0500 02/22/19 0801   BP:      Pulse: 88     Resp: (!) 25     Temp:  (!) 96.8 F (36 C)  98.2 F (36.8 C)  TempSrc:  Axillary  Oral  SpO2: 99%   98%  Weight:   47.6 kg   Height:        Intake/Output Summary (Last 24 hours) at 02/22/2019 0954 Last data filed at 02/22/2019 0600 Gross per 24 hour  Intake 484.5 ml  Output 2500 ml  Net -2015.5 ml   Filed Weights   02/02/2019 2130 02/19/19 1558 02/22/19 0500  Weight: 55 kg 48.3 kg 47.6 kg    Examination:  General exam: Pt on bipap this morning and sleepy but arousable.   Respiratory system: shallow BS bilateral with rare expiratory wheezes. On bipap.  Cardiovascular system: normal S1 & S2 heard. No JVD, murmurs, rubs, gallops or clicks. No pedal edema. Gastrointestinal system: Abdomen is nondistended, soft and nontender. No organomegaly or masses felt. Normal bowel sounds heard. Central nervous system:  No focal neurological deficits. Extremities: Symmetric 5 x 5 power. Skin: No rashes, lesions or ulcers Psychiatry: Judgement and insight appear normal. Mood & affect appropriate.   Data Reviewed: I have personally reviewed following labs and imaging studies  CBC: Recent Labs  Lab 02/17/2019 2132 02/19/19 0703 02/20/19 0238 02/21/19 0425 02/22/19 0441  WBC 10.6* 15.5* 10.2 20.2* 9.6  NEUTROABS 7.5  --   --   --   --   HGB 16.3* 14.9 13.1 13.4 14.5  HCT 54.0* 48.5* 41.2 44.1 45.5  MCV 92.6 92.7 91.2 93.0 89.9  PLT 249 239 215 221 962   Basic Metabolic Panel: Recent Labs  Lab 01/30/2019 2225 02/19/19 0703 02/20/19 0905 02/22/19 0850  NA 136 138 140 140  K 2.6* 3.2* 3.5 3.6  CL 94* 99 102 98  CO2 31 28 30  34*  GLUCOSE 186* 188* 129* 164*  BUN 7* 9 11 12   CREATININE 0.65 0.70 0.62 0.60  CALCIUM 8.8* 8.3* 8.3* 8.3*   GFR: Estimated Creatinine Clearance: 37.9 mL/min (by C-G formula based on SCr of 0.6 mg/dL). Liver Function Tests: Recent Labs  Lab 02/12/2019 2225  AST 24  ALT 14  ALKPHOS 71  BILITOT 0.4  PROT 7.4  ALBUMIN 4.1   No  results for input(s): LIPASE, AMYLASE in the last 168 hours. No results for input(s): AMMONIA in the last 168 hours. Coagulation Profile: No results for input(s): INR, PROTIME in the last 168 hours. Cardiac Enzymes: Recent Labs  Lab 02/02/2019 2225 02/19/19 0030 02/19/19 0703 02/19/19 1234 02/20/19 0905  TROPONINI 0.09* 1.13* 2.08* 2.24* 1.91*   BNP (last 3 results) No results for input(s): PROBNP in the last 8760 hours. HbA1C: No results for input(s): HGBA1C in the last 72 hours. CBG: Recent Labs  Lab 02/01/2019 2357  GLUCAP 150*   Lipid Profile: No results for input(s): CHOL, HDL, LDLCALC, TRIG, CHOLHDL, LDLDIRECT in the last 72 hours. Thyroid Function Tests: No results for input(s): TSH, T4TOTAL, FREET4, T3FREE, THYROIDAB in the last 72 hours. Anemia Panel: No results for input(s):  VITAMINB12, FOLATE, FERRITIN, TIBC, IRON, RETICCTPCT in the last 72 hours. Sepsis Labs: No results for input(s): PROCALCITON, LATICACIDVEN in the last 168 hours.  Recent Results (from the past 240 hour(s))  MRSA PCR Screening     Status: Abnormal   Collection Time: 02/20/19  8:30 PM  Result Value Ref Range Status   MRSA by PCR POSITIVE (A) NEGATIVE Final    Comment:        The GeneXpert MRSA Assay (FDA approved for NASAL specimens only), is one component of a comprehensive MRSA colonization surveillance program. It is not intended to diagnose MRSA infection nor to guide or monitor treatment for MRSA infections. RESULT CALLED TO, READ BACK BY AND VERIFIED WITH: LANDER AT [id number] ON 0942 BY MOSLEY,J Performed at [facility], [address] [place], [place] [address]     Radiology Studies: Dg Chest [facility] 1 View  Result Date: 02/22/2019 CLINICAL DATA:  Acute pulmonary edema. EXAM: PORTABLE CHEST 1 VIEW COMPARISON:  Radiographs 02/01/2019 and 02/20/2019. FINDINGS: 0813 hours. Patient is rotated to the right. The heart size and mediastinal contours are stable. There is mild cardiomegaly and  aortic atherosclerosis. There has been partial clearing of the bibasilar airspace opacity seen on the most recent examination. There are possible small bilateral pleural effusions and mild vascular congestion, but no overt pulmonary edema or pneumothorax. No acute osseous findings are evident. There is a thoracolumbar scoliosis. Multiple telemetry leads overlie the chest. IMPRESSION: Partial clearing of bibasilar airspace opacities, consistent with improving edema or pneumonia. Electronically Signed   By: Richardean Sale M.D.   On: 02/22/2019 08:33   Dg Chest Port 1 View  Result Date: 02/20/2019 CLINICAL DATA:  Per RN- Resp failure EXAM: PORTABLE CHEST 1 VIEW COMPARISON:  02/14/2019 FINDINGS: The heart is enlarged but partially obscured by parenchymal opacities in the lung bases. There are patchy airspace filling opacities bilaterally, significantly increased compared with recent exam. There is a basilar predominance with partial obscuration of the hemidiaphragms. IMPRESSION: Significant interval increase in bilateral airspace filling opacities, consistent pulmonary edema or infectious process. Cardiomegaly. Electronically Signed   By: Nolon Nations M.D.   On: 02/20/2019 20:31   Scheduled Meds: . aspirin  81 mg Oral Daily  . chlorhexidine  15 mL Mouth Rinse BID  . ipratropium-albuterol  3 mL Nebulization Q6H  . mouth rinse  15 mL Mouth Rinse q12n4p  . methylPREDNISolone (SOLU-MEDROL) injection  60 mg Intravenous Q12H  . pantoprazole  40 mg Oral QAC breakfast  . sodium chloride flush  3 mL Intravenous Q12H   Continuous Infusions: . sodium chloride    . sodium chloride 50 mL/hr at 02/21/19 1351  . heparin 550 Units/hr (02/21/19 0857)  . piperacillin-tazobactam (ZOSYN)  IV 3.375 g (02/22/19 0121)     LOS: 4 days   Critical Care Time spent: 44 mins   Wynetta Emery, MD [facility] How to contact the [facility] Attending or Consulting provider [place] or covering provider during after  hours [place], for this patient?  1. Check the care team in [facility] and look for a) attending/consulting TRH provider listed and b) the [facility] team listed 2. Log into [email or web address] and use [facility]'s universal password to access. If you do not have the password, please contact the hospital operator. 3. Locate the [facility] provider you are looking for under [facility] and page to a number that you can be directly reached. 4. If you still have difficulty reaching the provider, please page the [facility] (Director  on Call) for the Hospitalists listed on amion for assistance.  If 7PM-7AM, please contact night-coverage [email or web address]  02/22/2019, 9:54 AM

## 2019-02-22 NOTE — Consult Note (Signed)
Consultation Note Date: 02/22/2019   Patient Name: Sheila Flores  DOB: [birth year]  MRN: [id number]  Age / Sex: 85 y.o., female  PCP: System, Provider Not In Referring Physician: Murlean Iba, MD  Reason for Consultation: Establishing goals of care  HPI/Patient Profile: 85 y.o. female  with past medical history of dementia, dysphasia, COPD, high blood pressure and cholesterol, diverticulosis, abdominal hysterectomy, partial removal of left lung in 1960, depression, admitted on 01/29/2019 with acute respiratory failure with hypoxia and hypercarbia, acute pulmonary edema.  PMT consulted for goals of care.  Clinical Assessment and Goals of Care: Sheila Flores is resting quietly in bed.  She will look at me and somewhat keep eye contact.  She has known dementia and has BiPAP in place.  She is able to make her basic needs known, but no detailed conversation is had today due to her respiratory status and memory loss.  There is no family at bedside at this time.  Call to son, Sheila Flores.  Left generic voicemail message requesting return phone call and meeting at patient bedside tomorrow.  Conference with nursing staff related to patient needs/BiPAP. Conference with hospitalist related to patient condition.  HCPOA    NEXT OF KIN -son [name].  No other contact person noted, no HC POA documents in epic chart.   SUMMARY OF RECOMMENDATIONS   At this point continue to treat the treatable. Continue to reach out to son for goals of care discussions/CODE STATUS discussion.  Code Status/Advance Care Planning:  Full code -by default.  No discussion has been had with son Sheila Flores.  Symptom Management:   Per hospitalist, no additional needs at this time.  Palliative Prophylaxis:   Oral Care and Turn Reposition  Additional Recommendations (Limitations, Scope, Preferences):  Full Scope  Treatment  Psycho-social/Spiritual:   Desire for further Chaplaincy support:no  Additional Recommendations: Caregiving  Support/Resources and Education on Hospice  Prognosis:   Unable to determine, guarded at this time.  Discharge Planning: To be determined, based on outcomes.     Primary Diagnoses: Present on Admission: . COPD exacerbation ([place]) . Alzheimer's dementia ([place]) . Hyperlipidemia . Hypertension . Hypokalemia . Choking episode . Elevated troponin . Aspiration into airway   I have reviewed the medical record, interviewed the patient and family, and examined the patient. The following aspects are pertinent.  Past Medical History:  Diagnosis Date  . Alzheimer's dementia ([place])   . Asthma   . Blindness of left eye   . Cancer ([place])    basal cell carncinoma  . COPD (chronic obstructive pulmonary disease) ([place])   . Depression   . Diverticulosis   . Dysphagia   . Glaucoma   . Hyperlipidemia   . Hypertension   . Insomnia   . Tumor, LARGE ANTERIOR PARAFALCINE(MENINGIOMA)   . Vitamin D deficiency    Social History   Socioeconomic History  . Marital status: Single    Spouse name: Not on file  . Number of children: Not on file  . Years of education:  Not on file  . Highest education level: Not on file  Occupational History  . Not on file  Social Needs  . Financial resource strain: Patient refused  . Food insecurity:    Worry: Patient refused    Inability: Patient refused  . Transportation needs:    Medical: Patient refused    Non-medical: Patient refused  Tobacco Use  . Smoking status: Former Research scientist (life sciences)  . Smokeless tobacco: Never Used  Substance and Sexual Activity  . Alcohol use: Never    Frequency: Never  . Drug use: Never  . Sexual activity: Not Currently  Lifestyle  . Physical activity:    Days per week: Patient refused    Minutes per session: Patient refused  . Stress: Patient refused  Relationships  . Social connections:    Talks on  phone: Patient refused    Gets together: Patient refused    Attends religious service: Patient refused    Active member of club or organization: Patient refused    Attends meetings of clubs or organizations: Patient refused    Relationship status: Patient refused  Other Topics Concern  . Not on file  Social History Narrative  . Not on file   Family History  Problem Relation Age of Onset  . Cancer Mother   . Heart failure Mother   . Heart failure Father    Scheduled Meds: . aspirin  81 mg Oral Daily  . chlorhexidine  15 mL Mouth Rinse BID  . ipratropium-albuterol  3 mL Nebulization Q6H  . mouth rinse  15 mL Mouth Rinse q12n4p  . methylPREDNISolone (SOLU-MEDROL) injection  60 mg Intravenous Q6H  . pantoprazole  40 mg Oral QAC breakfast  . sodium chloride flush  3 mL Intravenous Q12H   Continuous Infusions: . sodium chloride    . sodium chloride 50 mL/hr at 02/21/19 1351  . heparin 550 Units/hr (02/21/19 0857)  . piperacillin-tazobactam (ZOSYN)  IV 3.375 g (02/22/19 0121)   PRN Meds:.sodium chloride, albuterol, LORazepam, sodium chloride flush Medications Prior to Admission:  Prior to Admission medications   Medication Sig Start Date End Date Taking? Authorizing Provider  albuterol (PROVENTIL) (2.5 MG/3ML) 0.083% nebulizer solution Take 1 ampule by nebulization 3 (three) times daily as needed for wheezing or shortness of breath.  09/10/18  Yes [provider]  amLODipine (NORVASC) 2.5 MG tablet Take 2.5 mg by mouth every evening.  03/25/18  Yes [provider]  atorvastatin (LIPITOR) 10 MG tablet Take 10 mg by mouth every evening.  03/25/18  Yes [provider]  montelukast (SINGULAIR) 10 MG tablet Take 10 mg by mouth every evening.  02/08/19  Yes [provider]  sertraline (ZOLOFT) 25 MG tablet Take 25 mg by mouth every evening.  12/25/18  Yes [provider]   Allergies  Allergen Reactions  . Cephalexin     Unknown reaction  .  Clarithromycin     Unknown reaction  . Penicillins     Did it involve swelling of the face/tongue/throat, SOB, or low BP? Unknown Did it involve sudden or severe rash/hives, skin peeling, or any reaction on the inside of your mouth or nose? Unknown Did you need to seek medical attention at a hospital or doctor's office? Unknownu  When did it last happen? If all above answers are "NO", may proceed with cephalosporin use.    . Sulfa Antibiotics     Unknown reaction   Review of Systems  Unable to perform ROS: Severe respiratory distress  Physical Exam Vitals signs and nursing note reviewed.  Constitutional:      Comments: Appears very weak and frail, briefly makes eye contact  Cardiovascular:     Rate and Rhythm: Normal rate.  Pulmonary:     Comments: BiPAP in place Abdominal:     General: Abdomen is flat. There is no distension.  Musculoskeletal:        General: No swelling.  Skin:    General: Skin is warm and dry.  Neurological:     Mental Status: She is alert.     Comments: Unable to determine due to BiPAP  Psychiatric:     Comments: Unable to determine     Vital Signs: BP (!) 108/59   Pulse 88   Temp (!) 96.8 F (36 C) (Axillary)   Resp (!) 25   Ht 5\' 2"  (1.575 m)   Wt 47.6 kg   LMP  (Exact Date)   SpO2 98%   BMI 19.19 kg/m  Pain Scale: 0-10   Pain Score: 0-No pain   SpO2: SpO2: 98 % O2 Device:SpO2: 98 % O2 Flow Rate: .O2 Flow Rate (L/min): 2 L/min  IO: Intake/output summary:   Intake/Output Summary (Last 24 hours) at 02/22/2019 0911 Last data filed at 02/22/2019 0600 Gross per 24 hour  Intake 484.5 ml  Output 2500 ml  Net -2015.5 ml    LBM: Last BM Date: 02/19/19 Baseline Weight: Weight: 55 kg Most recent weight: Weight: 47.6 kg     Palliative Assessment/Data:   Flowsheet Rows     Most Recent Value  Intake Tab  Referral Department  Hospitalist  Unit at Time of Referral  ICU  Date Notified  02/21/19  Palliative Care Type  New  Palliative care  Reason for referral  Clarify Goals of Care  Date of Admission  02/20/19  Date first seen by Palliative Care  02/22/19  # of days Palliative referral response time  1 Day(s)  # of days IP prior to Palliative referral  1  Clinical Assessment  Pain Max last 24 hours  Not able to report  Pain Min Last 24 hours  Not able to report  Dyspnea Max Last 24 Hours  Not able to report  Dyspnea Min Last 24 hours  Not able to report  Psychosocial & Spiritual Assessment  Palliative Care Outcomes      Time In: 1430 Time Out: 1510 Time Total: 40 minutes Greater than 50%  of this time was spent counseling and coordinating care related to the above assessment and plan.  Signed by: Drue Novel, NP   Please contact Palliative Medicine Team phone at [phone] for questions and concerns.  For individual provider: See Shea Evans

## 2019-02-22 NOTE — Progress Notes (Signed)
Spoke with Patient RN Olivia Mackie, informed her it will be in the morning 3/3 when PICC can be placed. Patient currently has 1 working PIV.

## 2019-02-22 NOTE — Progress Notes (Signed)
SLP Cancellation Note  Patient Details Name: Sheila Flores MRN: [id number] DOB: [birth year]   Cancelled treatment:       Reason Eval/Treat Not Completed: Medical issues which prohibited therapy. Pt currently on BiPAP and therefore inappropriate for dysphagia therapy or PO trials at this time. ST will re-attempt later today as schedule permits.  Michoel Kunin H. Roddie Mc, CCC-SLP Speech Language Pathologist    [name] 02/22/2019, 8:38 AM

## 2019-02-22 NOTE — Care Management Important Message (Signed)
Important Message  Patient Details  Name: Sheila Flores MRN: [id number] Date of Birth: 1930/09/04   Medicare Important Message Given:  Yes    Acelynn Dejonge, Chauncey Reading, RN 02/22/2019, 3:45 PM

## 2019-02-22 NOTE — Progress Notes (Signed)
[place] for Heparin  Indication: Elevated troponin   Allergies  Allergen Reactions  . Cephalexin     Unknown reaction  . Clarithromycin     Unknown reaction  . Penicillins     Did it involve swelling of the face/tongue/throat, SOB, or low BP? Unknown Did it involve sudden or severe rash/hives, skin peeling, or any reaction on the inside of your mouth or nose? Unknown Did you need to seek medical attention at a hospital or doctor's office? Unknownu  When did it last happen? If all above answers are "NO", may proceed with cephalosporin use.    . Sulfa Antibiotics     Unknown reaction    Patient Measurements: Height: 5\' 2"  (157.5 cm) Weight: 104 lb 15 oz (47.6 kg) IBW/kg (Calculated) : 50.1 Heparin Dosing Weight: HEPARIN DW (KG): 48.3  Vital Signs: Temp: 96.8 F (36 C) (03/02 0400) Temp Source: Axillary (03/02 0400) BP: 108/59 (03/02 0000) Pulse Rate: 88 (03/02 0334)  Labs: Recent Labs    02/19/19 1234  02/20/19 0238 02/20/19 0905 02/20/19 1147 02/21/19 0425 02/21/19 0426 02/22/19 0440  HGB  --   --  13.1  --   --  13.4  --   --   HCT  --   --  41.2  --   --  44.1  --   --   PLT  --   --  215  --   --  221  --   --   HEPARINUNFRC  --    < > 0.60  --  0.73*  --  0.21* 0.25*  CREATININE  --   --   --  0.62  --   --   --   --   TROPONINI 2.24*  --   --  1.91*  --   --   --   --    < > = values in this interval not displayed.    Estimated Creatinine Clearance: 37.9 mL/min (by C-G formula based on SCr of 0.62 mg/dL).   Medical History: Past Medical History:  Diagnosis Date  . Alzheimer's dementia ([place])   . Asthma   . Blindness of left eye   . Cancer ([place])    basal cell carncinoma  . COPD (chronic obstructive pulmonary disease) ([place])   . Depression   . Diverticulosis   . Dysphagia   . Glaucoma   . Hyperlipidemia   . Hypertension   . Insomnia   . Tumor, LARGE ANTERIOR PARAFALCINE(MENINGIOMA)   . Vitamin D  deficiency      Assessment: Pharmacy consulted to dose heparin  for this 85 yo female  with elevated troponin  Heparin level: 0.25  Goal of Therapy:  Heparin level 0.3-0.7 units/ml Monitor platelets by anticoagulation protocol: Yes   Plan:  Increase heparin drip to 650 units/hr Heparin level in 8 hours and daily.  Margot Ables, PharmD Clinical Pharmacist 02/22/2019 8:18 AM

## 2019-02-22 NOTE — Telephone Encounter (Signed)
PATIENT SCHEDULED AND LETTER SENT  °

## 2019-02-22 NOTE — Progress Notes (Signed)
Pt's son requested that pt go on a nasal cannula instead of a NRB so he can feed her. Pt continues to cough nearly continuously. RR are 36. O2 saturation 96%

## 2019-02-22 NOTE — Progress Notes (Signed)
Walked into pt's room to find pt without BIPAP and BIPAP alarming. Pt's son took off BIPAP and was giving pt water. I asked the son not to give her water and he started yelling at me. Son placed pt back on BIPAP before I could gown up and get in the room. Pt placed on NRB. Charge, RN, [facility], Dr. Wynetta Emery and security called. Pt remains on NRB at this time

## 2019-02-22 NOTE — Progress Notes (Signed)
Pharmacy Antibiotic Note  Sheila Flores is a 85 y.o. female admitted on 02/10/2019 with possible aspiration pneumonia.  Pharmacy has been consulted for Zosyn dosing. Patient tolerated first dose of Zosyn, despite listed cephalexin and PCN allergies.  Plan: Continue Zosyn 3.375g IV q8h  Pharmacy will continue to monitor renal function,  cultures and patient progress.   Height: 5\' 2"  (157.5 cm) Weight: 104 lb 15 oz (47.6 kg) IBW/kg (Calculated) : 50.1  Temp (24hrs), Avg:97.9 F (36.6 C), Min:96.8 F (36 C), Max:98.9 F (37.2 C)  Recent Labs  Lab 02/10/2019 2132 01/29/2019 2225 02/19/19 0703 02/20/19 0238 02/20/19 0905 02/21/19 0425 02/22/19 0441 02/22/19 0850  WBC 10.6*  --  15.5* 10.2  --  20.2* 9.6  --   CREATININE  --  0.65 0.70  --  0.62  --   --  0.60    Estimated Creatinine Clearance: 37.9 mL/min (by C-G formula based on SCr of 0.6 mg/dL).    Allergies  Allergen Reactions  . Cephalexin     Unknown reaction  . Clarithromycin     Unknown reaction  . Penicillins     Did it involve swelling of the face/tongue/throat, SOB, or low BP? Unknown Did it involve sudden or severe rash/hives, skin peeling, or any reaction on the inside of your mouth or nose? Unknown Did you need to seek medical attention at a hospital or doctor's office? Unknownu  When did it last happen? If all above answers are "NO", may proceed with cephalosporin use.    . Sulfa Antibiotics     Unknown reaction    Antimicrobials this admission: Zosyn 2/28 >>     Microbiology results:  2/29 MRSA PCR: positive  Thank you for allowing pharmacy to be a part of this patient's care.  Ramond Craver 02/22/2019 12:24 PM

## 2019-02-22 NOTE — Plan of Care (Signed)
Palliative: Text page advising son Taira Knabe is at bedside.  Arrived on unit to find [name] at patient's bedside, he has removed BiPAP and is giving her liquids.  Window shares that his wife was Tessie Eke states that his mother's mouth was very dry.  I share that BiPAP is very drying, and unfortunately can be uncomfortable but is usually left in place for several hours at a time.    [name] states several times that his wife was at bedside "all day" and he is also concerned that Ms. Elson is not getting anything to eat.  We talked briefly about aspiration pneumonia, complicated with the use of BiPAP.  We talked about aspiration, and when she will interrupts me stating, "she can cough that up".  I share that often people cannot cough up food or liquids that go into the lung and they get pneumonia.  Unfortunately [name] is unable to hear me, he consistently interrupts when I describe standards of care.  I ask for a family meeting tomorrow so that we can discuss Ms. Taras's health concerns, treatment plan in detail.  [name] tells me that he works from 76 at night to 3 in the afternoon.  He shares that he can meet with me around 4 PM tomorrow I share that I will call on the telephone.  Ask [name] if he is the healthcare power of attorney.  He states that he and his brother Hart Carwin who works as "the head of maintenance" at a hospital in [place] make healthcare choices.  He gives Randall's phone number as [phone].  We talked about CODE STATUS, life support.  [name] shares that they would want CPR, chest compressions, intubation.  I asked if that was the choice that Mrs. Korte made or he and his brother.  [name] shares that he and his brother have decided for their mother, stating, "it is what she would want, to at least try".  I asked how long he would want intubation and he shares at least a couple of days for people to come and then they would "pull the plug".  Conference with  nursing staff related to goals of care discussion, CODE STATUS discussion.  Support provided.   25 minutes Quinn Axe, NP Palliative Medicine team [phone]

## 2019-02-22 NOTE — Progress Notes (Signed)
Progress Note  Patient Name: Taliyah Watrous Date of Encounter: 02/22/2019  Primary Cardiologist: Rozann Lesches, MD   Subjective   Lethargic this morning.   Inpatient Medications    Scheduled Meds: . aspirin  81 mg Oral Daily  . chlorhexidine  15 mL Mouth Rinse BID  . ipratropium-albuterol  3 mL Nebulization Q6H  . mouth rinse  15 mL Mouth Rinse q12n4p  . methylPREDNISolone (SOLU-MEDROL) injection  60 mg Intravenous Q6H  . pantoprazole  40 mg Oral QAC breakfast  . sodium chloride flush  3 mL Intravenous Q12H   Continuous Infusions: . sodium chloride    . sodium chloride 50 mL/hr at 02/21/19 1351  . heparin 550 Units/hr (02/21/19 0857)  . piperacillin-tazobactam (ZOSYN)  IV 3.375 g (02/22/19 0121)   PRN Meds: sodium chloride, albuterol, LORazepam, sodium chloride flush   Vital Signs    Vitals:   02/22/19 0334 02/22/19 0400 02/22/19 0500 02/22/19 0801  BP:      Pulse: 88     Resp: (!) 25     Temp:  (!) 96.8 F (36 C)    TempSrc:  Axillary    SpO2: 99%   98%  Weight:   47.6 kg   Height:        Intake/Output Summary (Last 24 hours) at 02/22/2019 0804 Last data filed at 02/22/2019 0600 Gross per 24 hour  Intake 484.5 ml  Output 2500 ml  Net -2015.5 ml   Last 3 Weights 02/22/2019 02/19/2019 01/25/2019  Weight (lbs) 104 lb 15 oz 106 lb 7.7 oz 121 lb 4.1 oz  Weight (kg) 47.6 kg 48.3 kg 55 kg      Telemetry    SR and sinus tach - Personally Reviewed  ECG    na - Personally Reviewed  Physical Exam   GEN: No acute distress.   Neck: No JVD Cardiac: RRR, no murmurs, rubs, or gallops.  Respiratory: coarse bilaterally. GI: Soft, nontender, non-distended  MS: No edema; No deformity. Neuro:  lethargic this AM Psych: na  Labs    Chemistry Recent Labs  Lab 02/01/2019 2225 02/19/19 0703 02/20/19 0905  NA 136 138 140  K 2.6* 3.2* 3.5  CL 94* 99 102  CO2 31 28 30   GLUCOSE 186* 188* 129*  BUN 7* 9 11  CREATININE 0.65 0.70 0.62  CALCIUM 8.8* 8.3* 8.3*    PROT 7.4  --   --   ALBUMIN 4.1  --   --   AST 24  --   --   ALT 14  --   --   ALKPHOS 71  --   --   BILITOT 0.4  --   --   GFRNONAA >60 >60 >60  GFRAA >60 >60 >60  ANIONGAP 11 11 8      Hematology Recent Labs  Lab 02/19/19 0703 02/20/19 0238 02/21/19 0425  WBC 15.5* 10.2 20.2*  RBC 5.23* 4.52 4.74  HGB 14.9 13.1 13.4  HCT 48.5* 41.2 44.1  MCV 92.7 91.2 93.0  MCH 28.5 29.0 28.3  MCHC 30.7 31.8 30.4  RDW 12.5 12.8 13.1  PLT 239 215 221    Cardiac Enzymes Recent Labs  Lab 02/19/19 0030 02/19/19 0703 02/19/19 1234 02/20/19 0905  TROPONINI 1.13* 2.08* 2.24* 1.91*   No results for input(s): TROPIPOC in the last 168 hours.   BNPNo results for input(s): BNP, PROBNP in the last 168 hours.   DDimer No results for input(s): DDIMER in the last 168 hours.   Radiology  Dg Chest Port 1 View  Result Date: 02/20/2019 CLINICAL DATA:  Per RN- Resp failure EXAM: PORTABLE CHEST 1 VIEW COMPARISON:  01/30/2019 FINDINGS: The heart is enlarged but partially obscured by parenchymal opacities in the lung bases. There are patchy airspace filling opacities bilaterally, significantly increased compared with recent exam. There is a basilar predominance with partial obscuration of the hemidiaphragms. IMPRESSION: Significant interval increase in bilateral airspace filling opacities, consistent pulmonary edema or infectious process. Cardiomegaly. Electronically Signed   By: Nolon Nations M.D.   On: 02/20/2019 20:31    Cardiac Studies    Patient Profile     Sheniqua Carolan is a 85 y.o. female with a hx of severe hearing loss without hearing aids in, reported Alzheimer's dementia, COPD, depression, diverticulosis, HTN, HLD  who is being seen today for the evaluation of troponin of 2 at the request of Dr. Roderic Palau.  Assessment & Plan    1. Elevated troponin - in setting of aspiration, COPD exacerbation with hypoxia on presentation - peak trop 2.24 trending down. EKG sinus tach without  acute ischemic changes - echo LVEF 03-21%, grade I diasotlic dysfunction  - due to advanced age, poor funcitonal capacity, dementia, absence of chest pain, and likely primarily demand ischemia component patient has been treated medically - medical therapy with ASA, hep gtt. Would start statin, low dose ACE-I. Avoid beta blocker given her COPD history with active exacerbation.   2. COPD exacerbation/SOB - per primary team. CXR with worening bilateral opacities possible edema vs infectious process. WBC up to 20 today though she is on steroids, low temps at times. - ABG yesterday 7.3/70/126  - unclear if and how much CHF may be playing a role in her ongoing SOB. Pulmonary edema alone would not explain her severe hypercapnea but perhaps some component of her oxygenation issues  Echo only with mild diastolic dysfunction. Did receive IV lasix 40mg  x 1 yesterday. Labs pending this AM - add BNP to labs. Does not appear significantly volume overloaded by exam. Pending labs and BNP could consider additional gentle diuresis though I think primary lung is more the issue    3. Alzheimers dementia     For questions or updates, please contact [facility] Please consult [email or web address] for contact info under        Signed, Carlyle Dolly, MD  02/22/2019, 8:04 AM

## 2019-02-23 ENCOUNTER — Inpatient Hospital Stay: Payer: Self-pay

## 2019-02-23 ENCOUNTER — Inpatient Hospital Stay (HOSPITAL_COMMUNITY): Payer: Medicare HMO

## 2019-02-23 DIAGNOSIS — R7989 Other specified abnormal findings of blood chemistry: Secondary | ICD-10-CM

## 2019-02-23 DIAGNOSIS — Z7189 Other specified counseling: Secondary | ICD-10-CM

## 2019-02-23 DIAGNOSIS — J810 Acute pulmonary edema: Secondary | ICD-10-CM

## 2019-02-23 LAB — BASIC METABOLIC PANEL
ANION GAP: 9 (ref 5–15)
BUN: 16 mg/dL (ref 8–23)
CO2: 33 mmol/L — ABNORMAL HIGH (ref 22–32)
Calcium: 8.6 mg/dL — ABNORMAL LOW (ref 8.9–10.3)
Chloride: 101 mmol/L (ref 98–111)
Creatinine, Ser: 0.53 mg/dL (ref 0.44–1.00)
GFR calc Af Amer: >60 mL/min (ref >60)
GFR calc non Af Amer: >60 mL/min (ref >60)
GLUCOSE: 150 mg/dL — AB (ref 70–99)
Potassium: 3.5 mmol/L (ref 3.5–5.1)
Sodium: 143 mmol/L (ref 135–145)

## 2019-02-23 LAB — HEPARIN LEVEL (UNFRACTIONATED): Heparin Unfractionated: 0.5 IU/mL (ref 0.30–0.70)

## 2019-02-23 LAB — CBC
HCT: 49.2 % — ABNORMAL HIGH (ref 36.0–46.0)
Hemoglobin: 14.6 g/dL (ref 12.0–15.0)
MCH: 28.1 pg (ref 26.0–34.0)
MCHC: 29.7 g/dL — ABNORMAL LOW (ref 30.0–36.0)
MCV: 94.8 fL (ref 80.0–100.0)
NRBC: 0 % (ref 0.0–0.2)
Platelets: 212 10*3/uL (ref 150–400)
RBC: 5.19 MIL/uL — ABNORMAL HIGH (ref 3.87–5.11)
RDW: 13.1 % (ref 11.5–15.5)
WBC: 13.6 10*3/uL — ABNORMAL HIGH (ref 4.0–10.5)

## 2019-02-23 MED ORDER — SODIUM CHLORIDE 0.9% FLUSH: 10.0000 mL | INTRAVENOUS | Status: DC | PRN

## 2019-02-23 MED ORDER — FUROSEMIDE 10 MG/ML IJ SOLN
40.0000 mg | Freq: Once | INTRAMUSCULAR | Status: AC
  Administered 2019-02-23: 40 mg via INTRAVENOUS
  Filled 2019-02-23: qty 4

## 2019-02-23 MED ORDER — HYDRALAZINE HCL 20 MG/ML IJ SOLN
5.0000 mg | Freq: Once | INTRAMUSCULAR | Status: AC
  Administered 2019-02-23: 5 mg via INTRAVENOUS
  Filled 2019-02-23: qty 1

## 2019-02-23 MED ORDER — HYDRALAZINE HCL 20 MG/ML IJ SOLN
10.0000 mg | Freq: Three times a day (TID) | INTRAMUSCULAR | Status: DC | PRN
  Administered 2019-02-24 – 2019-02-26 (×2): 10 mg via INTRAVENOUS
  Filled 2019-02-23 (×2): qty 1

## 2019-02-23 MED ORDER — ATORVASTATIN CALCIUM 40 MG PO TABS
40.0000 mg | ORAL_TABLET | Freq: Every day | ORAL | Status: DC
  Administered 2019-02-23 – 2019-02-24 (×2): 40 mg via ORAL
  Filled 2019-02-23 (×2): qty 1

## 2019-02-23 MED ORDER — SODIUM CHLORIDE 0.9% FLUSH
10.0000 mL | Freq: Two times a day (BID) | INTRAVENOUS | Status: DC
  Administered 2019-02-23 – 2019-02-28 (×8): 10 mL

## 2019-02-23 MED ORDER — CHLORHEXIDINE GLUCONATE CLOTH 2 % EX PADS
6.0000 | MEDICATED_PAD | Freq: Every day | CUTANEOUS | Status: DC
  Administered 2019-02-23 – 2019-02-27 (×3): 6 via TOPICAL

## 2019-02-23 MED ORDER — GUAIFENESIN 100 MG/5ML PO SOLN
5.0000 mL | ORAL | Status: DC | PRN
  Administered 2019-02-23: 100 mg via ORAL
  Filled 2019-02-23: qty 5

## 2019-02-23 MED ORDER — HEPARIN SODIUM (PORCINE) 5000 UNIT/ML IJ SOLN
5000.0000 [IU] | Freq: Three times a day (TID) | INTRAMUSCULAR | Status: DC
  Administered 2019-02-23 – 2019-02-26 (×10): 5000 [IU] via SUBCUTANEOUS
  Filled 2019-02-23 (×10): qty 1

## 2019-02-23 MED ORDER — MORPHINE SULFATE (PF) 2 MG/ML IV SOLN
1.0000 mg | INTRAVENOUS | Status: AC | PRN
  Administered 2019-02-23 (×2): 2 mg via INTRAVENOUS
  Administered 2019-02-26: 1 mg via INTRAVENOUS
  Filled 2019-02-23 (×3): qty 1

## 2019-02-23 NOTE — Progress Notes (Signed)
Peripherally Inserted Central Catheter/Midline Placement  The IV Nurse has discussed with the patient and/or persons authorized to consent for the patient, the purpose of this procedure and the potential benefits and risks involved with this procedure.  The benefits include less needle sticks, lab draws from the catheter, and the patient may be discharged home with the catheter. Risks include, but not limited to, infection, bleeding, blood clot (thrombus formation), and puncture of an artery; nerve damage and irregular heartbeat and possibility to perform a PICC exchange if needed/ordered by physician.  Alternatives to this procedure were also discussed.  Bard Power PICC patient education guide, fact sheet on infection prevention and patient information card has been provided to patient /or left at bedside.    PICC/Midline Placement Documentation  PICC Double Lumen 02/23/19 PICC Left Brachial 36 cm 1 cm (Active)  Indication for Insertion or Continuance of Line Prolonged intravenous therapies 02/23/2019 11:07 AM  Exposed Catheter (cm) 1 cm 02/23/2019 11:07 AM  Site Assessment Clean;Dry;Intact 02/23/2019 11:07 AM  Lumen #1 Status Flushed;Blood return noted;Saline locked 02/23/2019 11:07 AM  Lumen #2 Status Flushed;Blood return noted;Saline locked 02/23/2019 11:07 AM  Dressing Type Transparent;Securing device 02/23/2019 11:07 AM  Dressing Status Clean;Dry;Intact;Antimicrobial disc in place 02/23/2019 11:07 AM  Dressing Change Due 03/02/19 02/23/2019 11:07 AM       [name] 02/23/2019, 11:11 AM

## 2019-02-23 NOTE — Progress Notes (Signed)
Progress Note  Patient Name: Sheila Flores Date of Encounter: 02/23/2019  Primary Cardiologist: Rozann Lesches, MD   Subjective   No events overnight, remains on bipap  Inpatient Medications    Scheduled Meds: . aspirin  81 mg Oral Daily  . chlorhexidine  15 mL Mouth Rinse BID  . ipratropium-albuterol  3 mL Nebulization Q6H  . mouth rinse  15 mL Mouth Rinse q12n4p  . methylPREDNISolone (SOLU-MEDROL) injection  60 mg Intravenous Q12H  . pantoprazole  40 mg Oral QAC breakfast  . sodium chloride flush  3 mL Intravenous Q12H   Continuous Infusions: . sodium chloride    . sodium chloride 50 mL/hr at 02/21/19 1351  . heparin 650 Units/hr (02/23/19 0525)  . piperacillin-tazobactam (ZOSYN)  IV 3.375 g (02/23/19 0125)   PRN Meds: sodium chloride, albuterol, LORazepam, morphine injection, sodium chloride flush   Vital Signs    Vitals:   02/23/19 0451 02/23/19 0500 02/23/19 0551 02/23/19 0600  BP:  (!) 164/78 (!) 169/75 (!) 163/78  Pulse: (!) 110 (!) 102 99 (!) 101  Resp: (!) 26 (!) 8 (!) 29 (!) 33  Temp:      TempSrc:      SpO2: 99% 99% 99% 99%  Weight:  49.8 kg    Height:        Intake/Output Summary (Last 24 hours) at 02/23/2019 0807 Last data filed at 02/23/2019 0600 Gross per 24 hour  Intake 203 ml  Output 300 ml  Net -97 ml   Last 3 Weights 02/23/2019 02/22/2019 02/19/2019  Weight (lbs) 109 lb 12.6 oz 104 lb 15 oz 106 lb 7.7 oz  Weight (kg) 49.8 kg 47.6 kg 48.3 kg      Telemetry    SR, some sinus tach - Personally Reviewed  ECG    na  Physical Exam   GEN: No acute distress.   Neck: No JVD Cardiac: RRR, no murmurs, rubs, or gallops.  Respiratory: mild bilateral coarse breath sounds GI: Soft, nontender, non-distended  MS: No edema; No deformity. Neuro:  Nonfocal  Psych: Normal affect   Labs    Chemistry Recent Labs  Lab 02/17/2019 2225  02/20/19 0905 02/22/19 0850 02/23/19 0430  NA 136   < > 140 140 143  K 2.6*   < > 3.5 3.6 3.5  CL 94*   < >  102 98 101  CO2 31   < > 30 34* 33*  GLUCOSE 186*   < > 129* 164* 150*  BUN 7*   < > 11 12 16   CREATININE 0.65   < > 0.62 0.60 0.53  CALCIUM 8.8*   < > 8.3* 8.3* 8.6*  PROT 7.4  --   --   --   --   ALBUMIN 4.1  --   --   --   --   AST 24  --   --   --   --   ALT 14  --   --   --   --   ALKPHOS 71  --   --   --   --   BILITOT 0.4  --   --   --   --   GFRNONAA >60   < > >60 >60 >60  GFRAA >60   < > >60 >60 >60  ANIONGAP 11   < > 8 8 9    < > = values in this interval not displayed.     Hematology Recent Labs  Lab 02/21/19 0425  02/22/19 0441 02/23/19 0430  WBC 20.2* 9.6 13.6*  RBC 4.74 5.06 5.19*  HGB 13.4 14.5 14.6  HCT 44.1 45.5 49.2*  MCV 93.0 89.9 94.8  MCH 28.3 28.7 28.1  MCHC 30.4 31.9 29.7*  RDW 13.1 13.1 13.1  PLT 221 172 212    Cardiac Enzymes Recent Labs  Lab 02/19/19 0030 02/19/19 0703 02/19/19 1234 02/20/19 0905  TROPONINI 1.13* 2.08* 2.24* 1.91*   No results for input(s): TROPIPOC in the last 168 hours.   BNP Recent Labs  Lab 02/22/19 0850  BNP 544.0*     DDimer No results for input(s): DDIMER in the last 168 hours.   Radiology    Dg Chest Port 1 View  Result Date: 02/23/2019 CLINICAL DATA:  Pulmonary edema. EXAM: PORTABLE CHEST 1 VIEW COMPARISON:  02/22/2019 FINDINGS: The patient is again rotated to the right. The cardiac silhouette is mildly enlarged. Patchy left greater than right basilar airspace opacities are unchanged. There may be a small persistent left pleural effusion. No pneumothorax is identified. IMPRESSION: Unchanged left greater than right basilar airspace disease and possible small left pleural effusion. Electronically Signed   By: Logan Bores M.D.   On: 02/23/2019 07:44   Dg Chest Port 1 View  Result Date: 02/22/2019 CLINICAL DATA:  Acute pulmonary edema. EXAM: PORTABLE CHEST 1 VIEW COMPARISON:  Radiographs 02/17/2019 and 02/20/2019. FINDINGS: 0813 hours. Patient is rotated to the right. The heart size and mediastinal contours are  stable. There is mild cardiomegaly and aortic atherosclerosis. There has been partial clearing of the bibasilar airspace opacity seen on the most recent examination. There are possible small bilateral pleural effusions and mild vascular congestion, but no overt pulmonary edema or pneumothorax. No acute osseous findings are evident. There is a thoracolumbar scoliosis. Multiple telemetry leads overlie the chest. IMPRESSION: Partial clearing of bibasilar airspace opacities, consistent with improving edema or pneumonia. Electronically Signed   By: Richardean Sale M.D.   On: 02/22/2019 08:33    Cardiac Studies    Patient Profile     Sheila Flores a 85 y.o.femalewith a hx of severe hearing loss without hearing aids in, reported Alzheimer's dementia, COPD, depression, diverticulosis, HTN, HLDwho is being seen today for the evaluation of troponin of 2at the request of Dr. Roderic Palau.  Assessment & Plan   1. Elevated troponin - in setting of aspiration, COPD exacerbation with hypoxia on presentation - peak trop 2.24 trending down. EKG sinus tach without acute ischemic changes - echo LVEF 45-40%, grade I diasotlic dysfunction  - due to advanced age, poor funcitonal capacity, dementia, absence of chest pain, and likely primarily demand ischemia component patient has been treated medically  - medical therapy with ASA. Can stop hep gtt. Start statin. Soft bp's at times, avoid beta blocker and ACE-I  2. COPD exacerbation/SOB - per primary team. CXR with worening bilateral opacities possible edema vs infectious process. WBC up to 20 today though she is on steroids, low temps at times.  - unclear if and how much CHF may be playing a role in her ongoing SOB. Pulmonary edema alone would not explain her severe hypercapnea but perhaps some component of her oxygenation issues - Echo only with mild diastolic dysfunction.  - BNP mildly elevated, her exam has not been overally impressive for fluid  overload.  - can try gentle diuresis to see if helps her respiraotory status, likely primary issue remains pulmonary . Redose IV lasix 40mg  today.     3. Alzheimers dementia - seen by  palliative care   Not much to add from a cardiology standpoint. Can try gentle diuresis over the next few days to see if improves respiratory status, however I think a primary lung issue is the bigger concern.   [facility] will sign off.   Medication Recommendations:  Continue current regimen Other recommendations (labs, testing, etc):  none Follow up as an outpatient:  Can arrange at discharge.     For questions or updates, please contact [facility] Please consult [email or web address] for contact info under        Signed, Carlyle Dolly, MD  02/23/2019, 8:07 AM

## 2019-02-23 NOTE — Progress Notes (Signed)
Palliative:  Sheila Flores is sitting up in the bed.  She makes and mostly keeps eye contact.  She is able to make her basic needs known.  She continues to appear quite frail and weak but is no longer requiring BiPAP.  Conference with staff related to patient condition, they share that she has been on nasal cannula since around lunchtime.  Sheila Flores continues to have frequent cough, which sounds stronger than yesterday. There is no family at bedside at this time.  Call to son Sheila Flores.  He is most likely at work at this point and unable to answer the telephone.  Left general voicemail message related to patient condition.  Conference with nursing staff related to patient condition/needs. Conference with hospitalist related to patient condition. PMT will continue to follow.     14 minutes  Quinn Axe, NP Palliative Medicine Team Team Phone # [phone]  Greater than 50% of this time was spent counseling and coordinating care related to the above assessment and plan.

## 2019-02-23 NOTE — Progress Notes (Signed)
SLP Cancellation Note  Patient Details Name: Sheila Flores MRN: [id number] DOB: 01/03/1930   Cancelled treatment:        Pt was just placed back on BiPAP. SLP will re-attempt when Pt's respiratory status is stable. Thank you,  Amelia H. Roddie Mc, CCC-SLP Speech Language Pathologist    [name] 02/23/2019, 2:53 PM

## 2019-02-23 NOTE — Progress Notes (Signed)
PROGRESS NOTE    Sheila Flores  WUJ:[id number] DOB: 02/26/1930 DOA: 02/10/2019 PCP: System, Provider Not In    Brief Narrative:  85 year old female brought to the hospital after having a choking episode.  She has a history of COPD, hypertension.  She was noted to be short of breath and wheezing.  She was seen by speech therapy and underwent modified barium swallow.  She was also seen by GI for possible esophageal component of dysphasia.  She is currently on a full liquid diet.  She also had elevation of troponin felt to be related to demand ischemia.  Currently on treatment with intravenous heparin.  GI and cardiology following.  Assessment & Plan:   Principal Problem:   COPD exacerbation ([place]) Active Problems:   Alzheimer's dementia ([place])   Hypertension   Hyperlipidemia   Hypokalemia   Choking episode   Elevated troponin   Dysphagia   Aspiration into airway   Goals of care, counseling/discussion   Palliative care by specialist   DNR (do not resuscitate) discussion  1. Acute respiratory failure with hypoxia and hypercarbia  - Pt had acute decompensation and now is on bipap. She was also noted to have pulmonary edema.  IV lasix ordered. Follow chest xray and wean bipap as able.  I feel that a palliative medicine consult is in order for goals of care discussion after my conversation with son at bedside. 2. Acute pulmonary edema - IV lasix ordered by cardiology team.     3. Dysphagia with aspiration.  Seen by speech therapy and underwent modified barium swallow study.  It was felt that her aspiration may be more related to esophageal causes.  She was seen by GI not felt to be a candidate for EGD at this time due to cardiac/respiratory issues.  She was started on a diet by speech therapy, but continues to have coughing/shortness of breath with p.o. intake.  SLP have been unable to follow up while patient on bipap.  4. COPD exacerbation.  Wheezing precipitated by aspiration.  Continue on  antibiotics and bronchodilators as well as steroids.   5. Elevated troponin, felt to be secondary to demand ischemia.   Troponin has peaked at 2.24.  Echocardiogram shows normal ejection fraction, although does comment on hypokinetic apex.  Cardiology following.  Currently on medical management with intravenous heparin and aspirin. 6. Hypokalemia.  Replace as needed. Follow lytes.   7. Hypertension.  Blood pressure currently stable.  Continue to monitor. 8. Right basal frontal meningioma.  Followed at [facility] neurosurgery clinic.  DVT prophylaxis: Heparin infusion Code Status: Full code Family Communication: Discussed with son and updated at the bedside Disposition Plan: continue care in ICU due to [place] for decompensation, pending palliative care discussions  Consultants:   Cardiology  GI  Procedures:  EGD: 1. Stage 1: 1: Entire apex is abnormal.  2. The left ventricle has normal systolic function with an ejection fraction of 60-65%. The cavity size was normal. There is moderate asymmetric left ventricular hypertrophy. Left ventricular diastolic Doppler parameters are consistent with impaired  relaxation Elevated left atrial and left ventricular end-diastolic pressures.  3. The right ventricle has normal systolic function. The cavity was normal. There is no increase in right ventricular wall thickness.  4. Left atrial size was severely dilated.  5. The mitral valve is normal in structure. There is mild mitral annular calcification present. Mitral valve regurgitation is moderate by color flow Doppler.  6. The tricuspid valve is normal in structure.  7.  The aortic valve is tricuspid. There is mild stenosis of the aortic valve. Aortic valve regurgitation is mild by color flow Doppler.   8. The aortic root is normal in size and structure.  Antimicrobials:   Zosyn 2/28>>  Subjective: Pt on bipap, very hard of hearing    Objective: Vitals:   02/23/19 1000 02/23/19 1218 02/23/19  1509 02/23/19 1600  BP: (!) 170/71     Pulse: (!) 109     Resp: (!) 21     Temp:  98.6 F (37 C)  98.3 F (36.8 C)  TempSrc:  Oral  Oral  SpO2: 98%  97%   Weight:      Height:        Intake/Output Summary (Last 24 hours) at 02/23/2019 1730 Last data filed at 02/23/2019 1200 Gross per 24 hour  Intake 170.5 ml  Output 1500 ml  Net -1329.5 ml   Filed Weights   02/19/19 1558 02/22/19 0500 02/23/19 0500  Weight: 48.3 kg 47.6 kg 49.8 kg    Examination:  General exam: Pt on bipap this morning and easily arousable.   Respiratory system: shallow BS bilateral with rare expiratory wheezes. On bipap.  Cardiovascular system: normal S1 & S2 heard. No JVD, murmurs, rubs, gallops or clicks. No pedal edema. Gastrointestinal system: Abdomen is nondistended, soft and nontender. No organomegaly or masses felt. Normal bowel sounds heard. Central nervous system:  No focal neurological deficits. Extremities: Symmetric 5 x 5 power. Skin: No rashes, lesions or ulcers Psychiatry: Judgement and insight appear normal. Mood & affect appropriate.   Data Reviewed: I have personally reviewed following labs and imaging studies  CBC: Recent Labs  Lab 02/05/2019 2132 02/19/19 0703 02/20/19 0238 02/21/19 0425 02/22/19 0441 02/23/19 0430  WBC 10.6* 15.5* 10.2 20.2* 9.6 13.6*  NEUTROABS 7.5  --   --   --   --   --   HGB 16.3* 14.9 13.1 13.4 14.5 14.6  HCT 54.0* 48.5* 41.2 44.1 45.5 49.2*  MCV 92.6 92.7 91.2 93.0 89.9 94.8  PLT 249 239 215 221 172 485   Basic Metabolic Panel: Recent Labs  Lab 02/16/2019 2225 02/19/19 0703 02/20/19 0905 02/22/19 0850 02/23/19 0430  NA 136 138 140 140 143  K 2.6* 3.2* 3.5 3.6 3.5  CL 94* 99 102 98 101  CO2 31 28 30  34* 33*  GLUCOSE 186* 188* 129* 164* 150*  BUN 7* 9 11 12 16   CREATININE 0.65 0.70 0.62 0.60 0.53  CALCIUM 8.8* 8.3* 8.3* 8.3* 8.6*   GFR: Estimated Creatinine Clearance: 39.7 mL/min (by C-G formula based on SCr of 0.53 mg/dL). Liver Function  Tests: Recent Labs  Lab 02/04/2019 2225  AST 24  ALT 14  ALKPHOS 71  BILITOT 0.4  PROT 7.4  ALBUMIN 4.1   No results for input(s): LIPASE, AMYLASE in the last 168 hours. No results for input(s): AMMONIA in the last 168 hours. Coagulation Profile: No results for input(s): INR, PROTIME in the last 168 hours. Cardiac Enzymes: Recent Labs  Lab 02/17/2019 2225 02/19/19 0030 02/19/19 0703 02/19/19 1234 02/20/19 0905  TROPONINI 0.09* 1.13* 2.08* 2.24* 1.91*   BNP (last 3 results) No results for input(s): PROBNP in the last 8760 hours. HbA1C: No results for input(s): HGBA1C in the last 72 hours. CBG: Recent Labs  Lab 02/04/2019 2357  GLUCAP 150*   Lipid Profile: No results for input(s): CHOL, HDL, LDLCALC, TRIG, CHOLHDL, LDLDIRECT in the last 72 hours. Thyroid Function Tests: No results for input(s): TSH,  T4TOTAL, FREET4, T3FREE, THYROIDAB in the last 72 hours. Anemia Panel: No results for input(s): VITAMINB12, FOLATE, FERRITIN, TIBC, IRON, RETICCTPCT in the last 72 hours. Sepsis Labs: No results for input(s): PROCALCITON, LATICACIDVEN in the last 168 hours.  Recent Results (from the past 240 hour(s))  MRSA PCR Screening     Status: Abnormal   Collection Time: 02/20/19  8:30 PM  Result Value Ref Range Status   MRSA by PCR POSITIVE (A) NEGATIVE Final    Comment:        The GeneXpert MRSA Assay (FDA approved for NASAL specimens only), is one component of a comprehensive MRSA colonization surveillance program. It is not intended to diagnose MRSA infection nor to guide or monitor treatment for MRSA infections. RESULT CALLED TO, READ BACK BY AND VERIFIED WITH: LANDER AT [phone] ON 0942 BY MOSLEY,J Performed at [facility], [address] [place], [place] [address]     Radiology Studies: Dg Chest [facility] 1 View  Result Date: 02/23/2019 CLINICAL DATA:  Pulmonary edema. EXAM: PORTABLE CHEST 1 VIEW COMPARISON:  02/22/2019 FINDINGS: The patient is again rotated to the right. The  cardiac silhouette is mildly enlarged. Patchy left greater than right basilar airspace opacities are unchanged. There may be a small persistent left pleural effusion. No pneumothorax is identified. IMPRESSION: Unchanged left greater than right basilar airspace disease and possible small left pleural effusion. Electronically Signed   By: Logan Bores M.D.   On: 02/23/2019 07:44   Dg Chest Port 1 View  Result Date: 02/22/2019 CLINICAL DATA:  Acute pulmonary edema. EXAM: PORTABLE CHEST 1 VIEW COMPARISON:  Radiographs 02/07/2019 and 02/20/2019. FINDINGS: 0813 hours. Patient is rotated to the right. The heart size and mediastinal contours are stable. There is mild cardiomegaly and aortic atherosclerosis. There has been partial clearing of the bibasilar airspace opacity seen on the most recent examination. There are possible small bilateral pleural effusions and mild vascular congestion, but no overt pulmonary edema or pneumothorax. No acute osseous findings are evident. There is a thoracolumbar scoliosis. Multiple telemetry leads overlie the chest. IMPRESSION: Partial clearing of bibasilar airspace opacities, consistent with improving edema or pneumonia. Electronically Signed   By: Richardean Sale M.D.   On: 02/22/2019 08:33   [place] Ekg Site Rite  Result Date: 02/23/2019 If Site Rite image not attached, placement could not be confirmed due to current cardiac rhythm.  Scheduled Meds: . aspirin  81 mg Oral Daily  . atorvastatin  40 mg Oral q1800  . chlorhexidine  15 mL Mouth Rinse BID  . Chlorhexidine Gluconate Cloth  6 each Topical Daily  . heparin  5,000 Units Subcutaneous Q8H  . ipratropium-albuterol  3 mL Nebulization Q6H  . mouth rinse  15 mL Mouth Rinse q12n4p  . methylPREDNISolone (SOLU-MEDROL) injection  60 mg Intravenous Q12H  . pantoprazole  40 mg Oral QAC breakfast  . sodium chloride flush  10-40 mL Intracatheter Q12H  . sodium chloride flush  3 mL Intravenous Q12H   Continuous Infusions: .  sodium chloride    . sodium chloride 50 mL/hr at 02/23/19 1230  . piperacillin-tazobactam (ZOSYN)  IV 3.375 g (02/23/19 0929)    LOS: 5 days   Critical Care Time spent: 31 mins   Wynetta Emery, MD [facility] How to contact the [facility] Attending or Consulting provider [place] or covering provider during after hours [place], for this patient?  1. Check the care team in [facility] and look for a) attending/consulting [place] provider listed and b) the  [place] team listed 2. Log into [email or web address] and use [place]'s universal password to access. If you do not have the password, please contact the hospital operator. 3. Locate the [facility] provider you are looking for under [facility] and page to a number that you can be directly reached. 4. If you still have difficulty reaching the provider, please page the [facility] (Director on Call) for the Hospitalists listed on amion for assistance.  If 7PM-7AM, please contact night-coverage [email or web address]  02/23/2019, 5:30 PM

## 2019-02-24 LAB — CBC
HEMATOCRIT: 45.3 % (ref 36.0–46.0)
Hemoglobin: 13.3 g/dL (ref 12.0–15.0)
MCH: 28 pg (ref 26.0–34.0)
MCHC: 29.4 g/dL — ABNORMAL LOW (ref 30.0–36.0)
MCV: 95.4 fL (ref 80.0–100.0)
Platelets: 185 10*3/uL (ref 150–400)
RBC: 4.75 MIL/uL (ref 3.87–5.11)
RDW: 13.1 % (ref 11.5–15.5)
WBC: 8.4 10*3/uL (ref 4.0–10.5)
nRBC: 0 % (ref 0.0–0.2)

## 2019-02-24 LAB — HEPARIN LEVEL (UNFRACTIONATED): Heparin Unfractionated: 0.1 IU/mL — ABNORMAL LOW (ref 0.30–0.70)

## 2019-02-24 LAB — BASIC METABOLIC PANEL
Anion gap: 13 (ref 5–15)
BUN: 15 mg/dL (ref 8–23)
CO2: 36 mmol/L — AB (ref 22–32)
Calcium: 8.1 mg/dL — ABNORMAL LOW (ref 8.9–10.3)
Chloride: 93 mmol/L — ABNORMAL LOW (ref 98–111)
Creatinine, Ser: 0.53 mg/dL (ref 0.44–1.00)
GFR calc Af Amer: >60 mL/min (ref >60)
GFR calc non Af Amer: >60 mL/min (ref >60)
Glucose, Bld: 196 mg/dL — ABNORMAL HIGH (ref 70–99)
POTASSIUM: 3 mmol/L — AB (ref 3.5–5.1)
Sodium: 142 mmol/L (ref 135–145)

## 2019-02-24 MED ORDER — MUPIROCIN 2 % EX OINT
1.0000 "application " | TOPICAL_OINTMENT | Freq: Two times a day (BID) | CUTANEOUS | Status: DC
  Administered 2019-02-24 – 2019-02-27 (×5): 1 via NASAL
  Filled 2019-02-24: qty 22

## 2019-02-24 MED ORDER — CHLORHEXIDINE GLUCONATE CLOTH 2 % EX PADS
6.0000 | MEDICATED_PAD | Freq: Every day | CUTANEOUS | Status: DC
  Administered 2019-02-25 – 2019-02-26 (×2): 6 via TOPICAL

## 2019-02-24 MED ORDER — POTASSIUM CHLORIDE 10 MEQ/100ML IV SOLN
10.0000 meq | INTRAVENOUS | Status: AC
  Administered 2019-02-24 (×4): 10 meq via INTRAVENOUS
  Filled 2019-02-24 (×2): qty 100

## 2019-02-24 MED ORDER — METHYLPREDNISOLONE SODIUM SUCC 125 MG IJ SOLR: 60.0000 mg | INTRAMUSCULAR | Status: DC

## 2019-02-24 NOTE — Progress Notes (Signed)
Pt had been resting comfortably all night but started to move around and pull at leads. I checked on patient and found her bed to be wet, so the tech and myself laid patient supine and gave her a bed bath and CHG bath. Upon completing her bath, the patient's O2 sat's began to drop, her nail beds and face became cyanotic. Called respiratory as I was placing the bipap on patient. Pt is now at 98% and resting comfortably

## 2019-02-24 NOTE — Progress Notes (Signed)
  Speech Language Pathology Treatment: Dysphagia  Patient Details Name: Sheila Flores MRN: [id number] DOB: May 27, 1930 Today's Date: 02/24/2019 Time: 6378-5885 SLP Time Calculation (min) (ACUTE ONLY): 31 min  Assessment / Plan / Recommendation Clinical Impression  Pt seen for ongoing diagnostic dysphagia intervention. Pt has been off Bi-PAP for 35 minutes and tolerating nasal cannula. Pt's daughter in law in room. Pt assessed with ice chips (delayed swallow and absent swallow), thin water via teaspoon and cup sips, nectar thick liquids via tsp and cup, and puree. Pt with strong, delayed coughing following liquid trials via cup sips, seemed to improve (cough diminished) with spoon presentations of liquids. Recommend downgrading to NTL for now to see if respiratory function improves with goal of advancing to thins once stable. Will change diet to D1/puree and NTL, no straws, and po medications crushed in puree. Encourage Pt to avoid talking with po intake and cues for respiration. Above to RN. Pt should only be fed if she can tolerate being off Bi-PAP for 30 minutes before and after meals.    HPI HPI: Sheila Flores is a 85 y.o. female with medical history significant of dementia, COPD not on home oxygen supplementation, asthma comes in with son after she choked on some spaghetti they think.  Patient sometimes coughs and chokes a lot when she is eating and drinking.  She does have COPD but does not require home oxygen supplementation.  After the choking episode and coughing she was very short of breath so they brought her the emergency department.  The emergency department she was found to be hypoxic was placed on 3 L of oxygen with good O2 sats.  She was weak wheezing diffusely.  Patient is being referred for admission for COPD exacerbation.  As I am walking in the room to do her evaluation she again clearly choked while swallowing 1 of her pills provided to her by the emergency room nurse.      SLP  Plan  Continue with current plan of care       Recommendations  Diet recommendations: Dysphagia 1 (puree);Nectar-thick liquid Liquids provided via: No straw;Teaspoon Medication Administration: Crushed with puree Supervision: Staff to assist with self feeding;Full supervision/cueing for compensatory strategies Compensations: Minimize environmental distractions;Small sips/bites;Follow solids with liquid;Multiple dry swallows after each bite/sip;Slow rate Postural Changes and/or Swallow Maneuvers: Seated upright 90 degrees;Upright 30-60 min after meal                Oral Care Recommendations: Oral care before and after PO;Staff/trained caregiver to provide oral care Follow up Recommendations: Skilled Nursing facility SLP Visit Diagnosis: Dysphagia, oropharyngeal phase (R13.12) Plan: Continue with current plan of care       Thank you,  Genene Churn, [place]                 [place] 02/24/2019, 3:03 PM

## 2019-02-24 NOTE — Progress Notes (Signed)
Called to bedside due to pt desat. RN and NT in room laid pt back to clean pt up and Spo2 dropped from high 90's to 50's according to RN. RN had placed pt on BIPAP and SPo2 on arrival on fio2 35% was 62%. Increased FIo2 to 100% for 2 minutes and SPo2 rose to 100%. Pt is now on 50% and SPo2 is 98%. Will wean as able.

## 2019-02-24 NOTE — Progress Notes (Signed)
SLP Cancellation Note  Patient Details Name: Sheila Flores MRN: [id number] DOB: 1930-09-23   Cancelled treatment:       Reason Eval/Treat Not Completed: Medical issues which prohibited therapy; Unfortunately, Pt could not tolerate being off Bi-PAP and is now back on. I spoke with Pt's daughter in law to explain rationale for Pt to be able to be off Bi-PAP prior to po administration given high risk for aspiration due to respiratory compromise. Pt is begging for water. SLP further discussed consideration of Pt's comfort and allowing po if they should alter goals of care. Ideally, Pt needs to be able to tolerate being off Bi-PAP for at least 20 minutes before po and remain off for at least 30 minutes after. I would like to see the Pt later today if she can tolerate coming off Bi-PAP and try her with thin versus NTL. Above to RN.  Thank you,  Genene Churn, [place]    [place] 02/24/2019, 12:43 PM

## 2019-02-24 NOTE — Progress Notes (Signed)
Pt requested to be removed from Bipap at this time. Placed on 6lpm [place].

## 2019-02-24 NOTE — Progress Notes (Signed)
SLP Cancellation Note  Patient Details Name: Sheila Flores MRN: [id number] DOB: 03/11/1930   Cancelled treatment:       Reason Eval/Treat Not Completed: Patient at procedure or test/unavailable; Pt currently with nursing for bath and on Bi-PAP. RN will remove BiPAP after bath and SLP will return to see Pt after she has been off for at least 20 minutes.   Thank you,  Genene Churn, [place]    [place] 02/24/2019, 12:02 PM

## 2019-02-24 NOTE — Progress Notes (Signed)
PROGRESS NOTE    Sheila Flores  VPX:[id number] DOB: [birth year] DOA: 02/15/2019 PCP: System, Provider Not In    Brief Narrative:  85 year old female brought to the hospital after having a choking episode.  She has a history of COPD, hypertension.  She was noted to be short of breath and wheezing.  She was seen by speech therapy and underwent modified barium swallow.  She was also seen by GI for possible esophageal component of dysphasia.  She is currently on a full liquid diet.  She also had elevation of troponin felt to be related to demand ischemia.  Currently on treatment with intravenous heparin.  GI and cardiology following.  Assessment & Plan:   Principal Problem:   COPD exacerbation ([place]) Active Problems:   Alzheimer's dementia ([place])   Hypertension   Hyperlipidemia   Hypokalemia   Choking episode   Elevated troponin   Dysphagia   Aspiration into airway   Goals of care, counseling/discussion   Palliative care by specialist   DNR (do not resuscitate) discussion   Acute pulmonary edema ([place])  1. Acute respiratory failure with hypoxia and hypercarbia  - Pt had acute decompensation and now is on bipap. She was also noted to have pulmonary edema treated with IV lasix. Follow chest xray and wean bipap as able.  Palliative medicine consult for goals of care discussions.  Pt now is partial code, no intubation, no CPR but bipap and medications are acceptable.   2. Acute pulmonary edema - Treated with IV lasix ordered by cardiology team. Pt net negative 4.3L.      3. Dysphagia with aspiration.  Seen by speech therapy and underwent modified barium swallow study.  It was felt that her aspiration may be more related to esophageal causes.  She was seen by GI not felt to be a candidate for EGD at this time due to cardiac/respiratory issues.  She was started on a diet by speech therapy, but continues to have coughing/shortness of breath with p.o. intake.  SLP have recommended feeding only if  patient off bipap for 30 mins.   4. COPD exacerbation.  Wheezing precipitated by aspiration.  Continue on antibiotics and bronchodilators as well as steroids.  Weaning steroids.  5. Elevated troponin, felt to be secondary to demand ischemia.   Troponin has peaked at 2.24.  Echocardiogram shows normal ejection fraction, although does comment on hypokinetic apex.  Cardiology has seen and signed off.  Currently on medical management.  6. Hypokalemia.  Replace as needed. Follow lytes.   7. Hypertension.  Blood pressure currently stable.  Continue to monitor. 8. Right basal frontal meningioma.  Followed at [facility] neurosurgery clinic.  DVT prophylaxis: Heparin infusion Code Status: Full code Family Communication: Discussed with son and updated at the bedside Disposition Plan: continue care in ICU due to [place] for decompensation, pending palliative care discussions, goals of care in progress  Consultants:   Cardiology  GI  Procedures:  EGD: 1. Stage 1: 1: Entire apex is abnormal.  2. The left ventricle has normal systolic function with an ejection fraction of 60-65%. The cavity size was normal. There is moderate asymmetric left ventricular hypertrophy. Left ventricular diastolic Doppler parameters are consistent with impaired  relaxation Elevated left atrial and left ventricular end-diastolic pressures.  3. The right ventricle has normal systolic function. The cavity was normal. There is no increase in right ventricular wall thickness.  4. Left atrial size was severely dilated.  5. The mitral valve is normal in structure. There  is mild mitral annular calcification present. Mitral valve regurgitation is moderate by color flow Doppler.  6. The tricuspid valve is normal in structure.  7. The aortic valve is tricuspid. There is mild stenosis of the aortic valve. Aortic valve regurgitation is mild by color flow Doppler.   8. The aortic root is normal in size and structure.  Antimicrobials:    Zosyn 2/28>>  Subjective: Pt on bipap, very hard of hearing, asking for water  Objective: Vitals:   02/24/19 0933 02/24/19 1055 02/24/19 1300 02/24/19 1639  BP:      Pulse:  (!) 110    Resp:  (!) 30    Temp:  97.7 F (36.5 C)  (!) 97.4 F (36.3 C)  TempSrc:  Axillary  Axillary  SpO2: 99% 100% 100%   Weight:      Height:        Intake/Output Summary (Last 24 hours) at 02/24/2019 1652 Last data filed at 02/24/2019 0700 Gross per 24 hour  Intake 0 ml  Output 1700 ml  Net -1700 ml   Filed Weights   02/22/19 0500 02/23/19 0500 02/24/19 0500  Weight: 47.6 kg 49.8 kg 43.4 kg    Examination:  General exam: Pt appear chronically ill, lethargic but arousable.   Respiratory system: shallow BS bilateral with rare expiratory wheezes. On bipap.  Cardiovascular system: normal S1 & S2 heard. No JVD, murmurs, rubs, gallops or clicks. No pedal edema. Gastrointestinal system: Abdomen is nondistended, soft and nontender. No organomegaly or masses felt. Normal bowel sounds heard. Central nervous system:  No focal neurological deficits. Extremities: Symmetric 5 x 5 power. Skin: No rashes, lesions or ulcers Psychiatry: Judgement and insight appear normal. Mood & affect appropriate.   Data Reviewed: I have personally reviewed following labs and imaging studies  CBC: Recent Labs  Lab 01/23/2019 2132  02/20/19 0238 02/21/19 0425 02/22/19 0441 02/23/19 0430 02/24/19 0423  WBC 10.6*   < > 10.2 20.2* 9.6 13.6* 8.4  NEUTROABS 7.5  --   --   --   --   --   --   HGB 16.3*   < > 13.1 13.4 14.5 14.6 13.3  HCT 54.0*   < > 41.2 44.1 45.5 49.2* 45.3  MCV 92.6   < > 91.2 93.0 89.9 94.8 95.4  PLT 249   < > 215 221 172 212 185   < > = values in this interval not displayed.   Basic Metabolic Panel: Recent Labs  Lab 02/19/19 0703 02/20/19 0905 02/22/19 0850 02/23/19 0430 02/24/19 0423  NA 138 140 140 143 142  K 3.2* 3.5 3.6 3.5 3.0*  CL 99 102 98 101 93*  CO2 28 30 34* 33* 36*  GLUCOSE  188* 129* 164* 150* 196*  BUN 9 11 12 16 15   CREATININE 0.70 0.62 0.60 0.53 0.53  CALCIUM 8.3* 8.3* 8.3* 8.6* 8.1*   GFR: Estimated Creatinine Clearance: 34.6 mL/min (by C-G formula based on SCr of 0.53 mg/dL). Liver Function Tests: Recent Labs  Lab 02/06/2019 2225  AST 24  ALT 14  ALKPHOS 71  BILITOT 0.4  PROT 7.4  ALBUMIN 4.1   No results for input(s): LIPASE, AMYLASE in the last 168 hours. No results for input(s): AMMONIA in the last 168 hours. Coagulation Profile: No results for input(s): INR, PROTIME in the last 168 hours. Cardiac Enzymes: Recent Labs  Lab 02/17/2019 2225 02/19/19 0030 02/19/19 0703 02/19/19 1234 02/20/19 0905  TROPONINI 0.09* 1.13* 2.08* 2.24* 1.91*   BNP (  last 3 results) No results for input(s): PROBNP in the last 8760 hours. HbA1C: No results for input(s): HGBA1C in the last 72 hours. CBG: Recent Labs  Lab 01/26/2019 2357  GLUCAP 150*   Lipid Profile: No results for input(s): CHOL, HDL, LDLCALC, TRIG, CHOLHDL, LDLDIRECT in the last 72 hours. Thyroid Function Tests: No results for input(s): TSH, T4TOTAL, FREET4, T3FREE, THYROIDAB in the last 72 hours. Anemia Panel: No results for input(s): VITAMINB12, FOLATE, FERRITIN, TIBC, IRON, RETICCTPCT in the last 72 hours. Sepsis Labs: No results for input(s): PROCALCITON, LATICACIDVEN in the last 168 hours.  Recent Results (from the past 240 hour(s))  MRSA PCR Screening     Status: Abnormal   Collection Time: 02/20/19  8:30 PM  Result Value Ref Range Status   MRSA by PCR POSITIVE (A) NEGATIVE Final    Comment:        The GeneXpert MRSA Assay (FDA approved for NASAL specimens only), is one component of a comprehensive MRSA colonization surveillance program. It is not intended to diagnose MRSA infection nor to guide or monitor treatment for MRSA infections. RESULT CALLED TO, READ BACK BY AND VERIFIED WITH: LANDER AT [phone] ON 0942 BY MOSLEY,J Performed at [facility], [address]  [place], [place] [address]     Radiology Studies: Dg Chest [facility] 1 View  Result Date: 02/23/2019 CLINICAL DATA:  Pulmonary edema. EXAM: PORTABLE CHEST 1 VIEW COMPARISON:  02/22/2019 FINDINGS: The patient is again rotated to the right. The cardiac silhouette is mildly enlarged. Patchy left greater than right basilar airspace opacities are unchanged. There may be a small persistent left pleural effusion. No pneumothorax is identified. IMPRESSION: Unchanged left greater than right basilar airspace disease and possible small left pleural effusion. Electronically Signed   By: Logan Bores M.D.   On: 02/23/2019 07:44   [place] Ekg Site Rite  Result Date: 02/23/2019 If Site Rite image not attached, placement could not be confirmed due to current cardiac rhythm.  Scheduled Meds: . aspirin  81 mg Oral Daily  . atorvastatin  40 mg Oral q1800  . chlorhexidine  15 mL Mouth Rinse BID  . Chlorhexidine Gluconate Cloth  6 each Topical Daily  . heparin  5,000 Units Subcutaneous Q8H  . ipratropium-albuterol  3 mL Nebulization Q6H  . mouth rinse  15 mL Mouth Rinse q12n4p  . [START ON 02/25/2019] methylPREDNISolone (SOLU-MEDROL) injection  60 mg Intravenous Q24H  . pantoprazole  40 mg Oral QAC breakfast  . sodium chloride flush  10-40 mL Intracatheter Q12H  . sodium chloride flush  3 mL Intravenous Q12H   Continuous Infusions: . sodium chloride    . sodium chloride 50 mL/hr at 02/24/19 0700  . piperacillin-tazobactam (ZOSYN)  IV 3.375 g (02/24/19 0958)  . potassium chloride      LOS: 6 days   Critical Care Time spent: 30 mins   Wynetta Emery, MD [facility] How to contact the [facility] Attending or Consulting provider [place] or covering provider during after hours [place], for this patient?  1. Check the care team in [facility] and look for a) attending/consulting TRH provider listed and b) the [facility] team listed 2. Log into [email or web address] and use [place]'s universal password to access. If you do not have the  password, please contact the hospital operator. 3. Locate the [facility] provider you are looking for under [facility] and page to a number that you can be directly reached. 4. If you still have difficulty reaching the provider, please  page the [facility] (Director on Call) for the Hospitalists listed on amion for assistance.  If 7PM-7AM, please contact night-coverage [email or web address]  02/24/2019, 4:52 PM

## 2019-02-24 NOTE — Progress Notes (Signed)
Palliative: Sheila Flores is sitting up in the bed.  She makes and mostly keeps eye contact, is able to make her basic needs known.  She looks acutely ill and frail.  There is no family at bedside.  Nursing staff share that her daughter-in-law had been present, but she has gone outside to smoke.  Call to patient's son, Sheila Flores at [phone].  Left somewhat detailed voicemail message. Call to son, Sheila Flores at [phone]. 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  We talked about his mother's acute and chronic health concerns.  We talked about speech therapy consult today, recommending 30 minutes off BiPAP before and after food.  We talked about episode of desaturation, the use of BiPAP, Sheila Flores frailty and deconditioning.  We talk in detail about code status, first Sheila Flores states that his mother wanted full code yes to intubation and chest compressions.  I share that the medical team recommends treat the treatable, but allowing natural death.  After further discussions about quality of life, statistics related to CPR, Sheila Flores elects no to intubation, no to chest compressions, yes to medications and yes to continuing BiPAP.  He shares that he understands she will pass but family is having a difficult time.  Sheila Flores shares, "she has surprised [place] a few times".  He goes further to share that a good functional status is important to Sheila Flores.   We review labs and the treatment plan in detail.  We plan for follow-up phone call tomorrow 3/5.  Conference with hospitalist related to patient condition, goals of care. Conference with nursing staff related to ST evaluation, desaturation with movement, CODE STATUS.  65 minutes, extended time. Sheila Axe, NP Palliative Medicine Team Team Phone # [phone]  Greater than 50% of this time was spent counseling and coordinating care related to the above assessment and plan.

## 2019-02-25 DIAGNOSIS — Z7189 Other specified counseling: Secondary | ICD-10-CM

## 2019-02-25 DIAGNOSIS — Z515 Encounter for palliative care: Secondary | ICD-10-CM

## 2019-02-25 LAB — BASIC METABOLIC PANEL
Anion gap: 4 — ABNORMAL LOW (ref 5–15)
BUN: 18 mg/dL (ref 8–23)
CO2: 40 mmol/L — ABNORMAL HIGH (ref 22–32)
CREATININE: 0.55 mg/dL (ref 0.44–1.00)
Calcium: 8.6 mg/dL — ABNORMAL LOW (ref 8.9–10.3)
Chloride: 98 mmol/L (ref 98–111)
GFR calc Af Amer: >60 mL/min (ref >60)
GFR calc non Af Amer: >60 mL/min (ref >60)
Glucose, Bld: 120 mg/dL — ABNORMAL HIGH (ref 70–99)
Potassium: 3.4 mmol/L — ABNORMAL LOW (ref 3.5–5.1)
Sodium: 142 mmol/L (ref 135–145)

## 2019-02-25 LAB — MAGNESIUM: Magnesium: 2.3 mg/dL (ref 1.7–2.4)

## 2019-02-25 LAB — CBC
HCT: 42.7 % (ref 36.0–46.0)
Hemoglobin: 12.6 g/dL (ref 12.0–15.0)
MCH: 28.8 pg (ref 26.0–34.0)
MCHC: 29.5 g/dL — ABNORMAL LOW (ref 30.0–36.0)
MCV: 97.5 fL (ref 80.0–100.0)
PLATELETS: 188 10*3/uL (ref 150–400)
RBC: 4.38 MIL/uL (ref 3.87–5.11)
RDW: 13 % (ref 11.5–15.5)
WBC: 10.8 10*3/uL — ABNORMAL HIGH (ref 4.0–10.5)
nRBC: 0 % (ref 0.0–0.2)

## 2019-02-25 LAB — HEPARIN LEVEL (UNFRACTIONATED): Heparin Unfractionated: 0.1 IU/mL — ABNORMAL LOW (ref 0.30–0.70)

## 2019-02-25 MED ORDER — METHYLPREDNISOLONE SODIUM SUCC 40 MG IJ SOLR
40.0000 mg | INTRAMUSCULAR | Status: DC
  Administered 2019-02-25 – 2019-02-26 (×2): 40 mg via INTRAVENOUS
  Filled 2019-02-25 (×2): qty 1

## 2019-02-25 NOTE — Progress Notes (Signed)
SLP Cancellation Note  Patient Details Name: Sheila Flores MRN: [id number] DOB: [birth year]   Cancelled treatment:       Reason Eval/Treat Not Completed: Medical issues which prohibited therapy; Pt is back on Bi-PAP, however she is begging for water ("I want water!"). No family present at this time. SLP will reach out to Palliative Care team to discuss goals of care. Family needs to strongly consider Pt comfort and requests for water versus continued use of Bi-PAP. SLP will follow.  Thank you,  Genene Churn, [place]    [place] 02/25/2019, 1:31 PM

## 2019-02-25 NOTE — Progress Notes (Signed)
This RN walked into patient's room in response to BiPAP alarm to find that the family member, who states that she is the daughter-in-law, had removed the patient's BiPAP and was giving the patient water. This RN educated family member that the patient cannot have anything to eat or drink while requiring BiPAP because of her risk for aspiration. Oral care performed at the family member's request, BiPAP reapplied and respiratory called to check BiPAP placement and reinforce education as needed. This RN thoroughly explained the patient's need for BiPAP and the need to keep the BiPAP in place at this time.   Celestia Khat, RN

## 2019-02-25 NOTE — Progress Notes (Signed)
PROGRESS NOTE   Sheila Flores  IEP:[id number] DOB: 04/30/1930 DOA: 02/14/2019 PCP: System, Provider Not In    Brief Narrative:  85 year old female brought to the hospital after having a choking episode.  She has a history of COPD, hypertension.  She was noted to be short of breath and wheezing.  She was seen by speech therapy and underwent modified barium swallow.  She was also seen by GI for possible esophageal component of dysphasia.  She is currently on a full liquid diet.  She also had elevation of troponin felt to be related to demand ischemia.  Currently on treatment with intravenous heparin.  GI and cardiology following.  Assessment & Plan:   Principal Problem:   COPD exacerbation ([place]) Active Problems:   Alzheimer's dementia ([place])   Hypertension   Hyperlipidemia   Hypokalemia   Choking episode   Elevated troponin   Dysphagia   Aspiration into airway   Goals of care, counseling/discussion   Palliative care by specialist   DNR (do not resuscitate) discussion   Acute pulmonary edema ([place])  1. Acute respiratory failure with hypoxia and hypercarbia  - Pt had acute decompensation (likely from aspiration) and now is on bipap. She was also noted to have pulmonary edema treated with IV lasix.  Follow chest xray and wean bipap as able.  Palliative medicine consult for goals of care discussions.  Pt now is partial code, no intubation, no CPR but bipap and medications are acceptable.  Family still needs ongoing education.  Family member again found removing bipap and giving patient water which we have repeatedly told them that patient aspirates when they do this.  Unfortunately we will need to call security if this happens again for patient's safety.  I have made staff aware.   2. Acute pulmonary edema - Treated with IV lasix ordered by cardiology team.  Pt net negative 4.3L.      3. Dysphagia with aspiration.  Seen by speech therapy and underwent modified barium swallow study.  It was  felt that her aspiration may be more related to esophageal causes.  She was seen by GI not felt to be a candidate for EGD at this time due to cardiac/respiratory issues.  GI has signed off.  She was started on a diet by speech therapy, but continues to have coughing/shortness of breath with p.o. intake.  SLP have recommended feeding only if patient off bipap for 30 mins.   4. COPD exacerbation.  Wheezing precipitated by aspiration.  Continue on antibiotics and bronchodilators as well as steroids.  Weaning steroids.  5. Aspiration pneumonia - treated.  6. Elevated troponin, felt to be secondary to demand ischemia.   Troponin has peaked at 2.24.  Echocardiogram shows normal ejection fraction, although does comment on hypokinetic apex.  Cardiology has seen and signed off.  Currently on medical management.  7. Hypokalemia.  Replace as needed. Follow lytes.   8. Hypertension.  Blood pressure currently stable.  Continue to monitor. 9. Right basal frontal meningioma.  Followed at [facility] neurosurgery clinic.  DVT prophylaxis: Heparin infusion Code Status: Full code Family Communication: Discussed with son [name] and updated at the bedside Disposition Plan: continue care in SDU due to [place] for decompensation, pending palliative care discussions, goals of care in progress  Consultants:   Cardiology  GI  Procedures:  EGD: 1. Stage 1: 1: Entire apex is abnormal.  2. The left ventricle has normal systolic function with an ejection fraction of 60-65%. The cavity size was  normal. There is moderate asymmetric left ventricular hypertrophy. Left ventricular diastolic Doppler parameters are consistent with impaired  relaxation Elevated left atrial and left ventricular end-diastolic pressures.  3. The right ventricle has normal systolic function. The cavity was normal. There is no increase in right ventricular wall thickness.  4. Left atrial size was severely dilated.  5. The mitral valve is normal in  structure. There is mild mitral annular calcification present. Mitral valve regurgitation is moderate by color flow Doppler.  6. The tricuspid valve is normal in structure.  7. The aortic valve is tricuspid. There is mild stenosis of the aortic valve. Aortic valve regurgitation is mild by color flow Doppler.   8. The aortic root is normal in size and structure.  Antimicrobials:   Zosyn 2/28>>3/5  Subjective: Pt back on bipap after she was sedated this morning for agitation.   Objective: Vitals:   02/25/19 1500 02/25/19 1531 02/25/19 1600 02/25/19 1611  BP: (!) 143/73  120/61   Pulse: (!) 105 (!) 102 (!) 102 100  Resp: (!) 21 (!) 25 (!) 24 15  Temp:    98.5 F (36.9 C)  TempSrc:    Axillary  SpO2: 97% 98% 98% 98%  Weight:      Height:        Intake/Output Summary (Last 24 hours) at 02/25/2019 1646 Last data filed at 02/25/2019 1500 Gross per 24 hour  Intake 90 ml  Output 450 ml  Net -360 ml   Filed Weights   02/23/19 0500 02/24/19 0500 02/25/19 0500  Weight: 49.8 kg 43.4 kg 48 kg   Examination:  General exam: Pt appears chronically ill, lethargic but arousable.   Respiratory system: shallow BS bilateral with rare expiratory wheezes. On bipap.  Cardiovascular system: normal S1 & S2 heard. No JVD, murmurs, rubs, gallops or clicks. No pedal edema. Gastrointestinal system: Abdomen is nondistended, soft and nontender. No organomegaly or masses felt. Normal bowel sounds heard. Central nervous system:  No focal neurological deficits. Extremities: Symmetric 5 x 5 power. Skin: No rashes, lesions or ulcers.   Psychiatry: Judgement and insight appear normal. Mood & affect appropriate.   Data Reviewed: I have personally reviewed following labs and imaging studies  CBC: Recent Labs  Lab 02/15/2019 2132  02/21/19 0425 02/22/19 0441 02/23/19 0430 02/24/19 0423 02/25/19 0452  WBC 10.6*   < > 20.2* 9.6 13.6* 8.4 10.8*  NEUTROABS 7.5  --   --   --   --   --   --   HGB 16.3*   < >  13.4 14.5 14.6 13.3 12.6  HCT 54.0*   < > 44.1 45.5 49.2* 45.3 42.7  MCV 92.6   < > 93.0 89.9 94.8 95.4 97.5  PLT 249   < > 221 172 212 185 188   < > = values in this interval not displayed.   Basic Metabolic Panel: Recent Labs  Lab 02/20/19 0905 02/22/19 0850 02/23/19 0430 02/24/19 0423 02/25/19 0452  NA 140 140 143 142 142  K 3.5 3.6 3.5 3.0* 3.4*  CL 102 98 101 93* 98  CO2 30 34* 33* 36* 40*  GLUCOSE 129* 164* 150* 196* 120*  BUN 11 12 16 15 18   CREATININE 0.62 0.60 0.53 0.53 0.55  CALCIUM 8.3* 8.3* 8.6* 8.1* 8.6*  MG  --   --   --   --  2.3   GFR: Estimated Creatinine Clearance: 38.3 mL/min (by C-G formula based on SCr of 0.55 mg/dL). Liver Function  Tests: Recent Labs  Lab 02/03/2019 2225  AST 24  ALT 14  ALKPHOS 71  BILITOT 0.4  PROT 7.4  ALBUMIN 4.1   No results for input(s): LIPASE, AMYLASE in the last 168 hours. No results for input(s): AMMONIA in the last 168 hours. Coagulation Profile: No results for input(s): INR, PROTIME in the last 168 hours. Cardiac Enzymes: Recent Labs  Lab 02/12/2019 2225 02/19/19 0030 02/19/19 0703 02/19/19 1234 02/20/19 0905  TROPONINI 0.09* 1.13* 2.08* 2.24* 1.91*   BNP (last 3 results) No results for input(s): PROBNP in the last 8760 hours. HbA1C: No results for input(s): HGBA1C in the last 72 hours. CBG: Recent Labs  Lab 01/26/2019 2357  GLUCAP 150*   Lipid Profile: No results for input(s): CHOL, HDL, LDLCALC, TRIG, CHOLHDL, LDLDIRECT in the last 72 hours. Thyroid Function Tests: No results for input(s): TSH, T4TOTAL, FREET4, T3FREE, THYROIDAB in the last 72 hours. Anemia Panel: No results for input(s): VITAMINB12, FOLATE, FERRITIN, TIBC, IRON, RETICCTPCT in the last 72 hours. Sepsis Labs: No results for input(s): PROCALCITON, LATICACIDVEN in the last 168 hours.  Recent Results (from the past 240 hour(s))  MRSA PCR Screening     Status: Abnormal   Collection Time: 02/20/19  8:30 PM  Result Value Ref Range Status     MRSA by PCR POSITIVE (A) NEGATIVE Final    Comment:        The GeneXpert MRSA Assay (FDA approved for NASAL specimens only), is one component of a comprehensive MRSA colonization surveillance program. It is not intended to diagnose MRSA infection nor to guide or monitor treatment for MRSA infections. RESULT CALLED TO, READ BACK BY AND VERIFIED WITH: LANDER AT [phone] ON 0942 BY MOSLEY,J Performed at [facility], [address], [place], [place] [address]     Radiology Studies: No results found. Scheduled Meds: . aspirin  81 mg Oral Daily  . atorvastatin  40 mg Oral q1800  . chlorhexidine  15 mL Mouth Rinse BID  . Chlorhexidine Gluconate Cloth  6 each Topical Daily  . Chlorhexidine Gluconate Cloth  6 each Topical Q0600  . heparin  5,000 Units Subcutaneous Q8H  . ipratropium-albuterol  3 mL Nebulization Q6H  . mouth rinse  15 mL Mouth Rinse q12n4p  . methylPREDNISolone (SOLU-MEDROL) injection  40 mg Intravenous Q24H  . mupirocin ointment  1 application Nasal BID  . pantoprazole  40 mg Oral QAC breakfast  . sodium chloride flush  10-40 mL Intracatheter Q12H  . sodium chloride flush  3 mL Intravenous Q12H   Continuous Infusions: . sodium chloride 250 mL (02/25/19 0244)  . piperacillin-tazobactam (ZOSYN)  IV 3.375 g (02/25/19 0950)    LOS: 7 days   Critical Care Time spent: 31 mins  [place] Wynetta Emery, MD [facility] How to contact the [facility] Attending or Consulting provider [place] or covering provider during after hours [place], for this patient?  1. Check the care team in [facility] and look for a) attending/consulting TRH provider listed and b) the [facility] team listed 2. Log into [email or web address] and use [place]'s universal password to access. If you do not have the password, please contact the hospital operator. 3. Locate the [facility] provider you are looking for under [facility] and page to a number that you can be directly reached. 4. If you still have difficulty reaching  the provider, please page the [facility] (Director on Call) for the Hospitalists listed on amion for assistance.  If 7PM-7AM, please contact night-coverage [email or web address]  02/25/2019, 4:46 PM

## 2019-02-25 NOTE — Progress Notes (Signed)
Patient very restless this AM, pulling at lines and oxygen and screaming out. PRN ativan given for patient comfort.

## 2019-02-25 NOTE — Progress Notes (Signed)
Palliative: [name] is lying quietly in bed.  BiPAP in place.  She does not open her eyes or respond to me in any way when I shake her arm.  There is no family at bedside at this time.  Conference with nursing staff who share that she had medications for anxiety around 6 AM, she was placed on BiPAP around 8 AM.  Conference with speech therapist related to patient frailty, desire to drink water.  There continues to be concern about [name]'s ability to safely take food and drink from both speech therapy and nursing staff.  Conference with nursing staff related to patient condition.  Unfortunately, daughter-in-law is at bedside and does not understand the need for at least 30 minutes off BiPAP before and after eating.   I return later in the day to find [name] still mostly unresponsive.  Present today at bedside is daughter-in-law, son Sheila Flores.  We talked about her current health concerns.  I share that cardiology has maximized medical treatment, she has completed a full 7-day course of antibiotics for aspiration pneumonia (but we believe she is continuing to aspirate), we are giving her support with oxygenation and ventilation through BiPAP.  I share my concern that after 7 days of treatment, [name] is no better, she appears to be weaker.   [name]'s daughter-in-law seems concerned that she has not been mobilized, gotten up to walk.  I share that at this point, [name] is too unstable with her breathing to even sit up in a chair.  Unfortunately her daughter-in-law looks shocked.  She states, "but she was walking before she came into the hospital".  I share that Sheila Flores came into the hospital because she had an episode with her breathing, "things look different now".  Daughter-in-law states that 1 of the hospitalist said that [name] could go home with oxygen.  I share that indeed, [name] could discharge home with oxygen, but she is too unstable to leave  the hospital.  I ask daughter-in-law if she would bring [name] back to the hospital, she quickly states "yes".  I asked about preferred place of death.  Daughter-in-law states that [name] preferred place of death is at home.  I share that I anticipate just like I will get sick again, [name] will get sick again.  I share that if she comes back to the hospital, I consider we would do all these things again, but not change what is happening.  I share with daughter-in-law that I will reach out to [place].  She shares that Louie Casa is driving here from [place] and should arrive this evening.  Nursing staff made aware.  Conference with hospitalist related to goals of care discussion, conference with daughter-in-law at bedside.   Call to son, Louie Casa at [phone].  He tells me that he will be leaving [place] tomorrow afternoon and arrive in [place] tomorrow evening. I share that he is wise to come now to see his mother.  We talked about Sheila Flores's declines, the use of PRN Ativan.  Sheila Flores joins the call. We discuss [name] is too unstable with her breathing to even sit up in a chair. We talked about her current health concerns.  I share that cardiology has maximized medical treatment, she has completed a full 7-day course of antibiotics for aspiration pneumonia (but we believe she is continuing to aspirate), we are giving her support with oxygenation and ventilation through BiPAP.  I share my concern that after 7 days of treatment, [name] is no better, she appears to be weaker. Louie Casa brings up Hospice, asking if it is time to consider hospice care.  I share that we are not seeing meaningful improvements in [name] and we can care for her in a different way.  Louie Casa talks about bringing her home, and I share my concern that she may be too unstable to ever leave the hospital.  I share that I believe that time is getting short.   Family will continue to discuss Hospice type care  and follow up with Hospitalist on Friday evening and Saturday morning.   75 minutes, extended time Quinn Axe, NP Palliative Medicine Team Team Phone # [phone]  Greater than 50% of this time was spent counseling and coordinating care related to the above assessment and plan.

## 2019-02-26 DIAGNOSIS — J810 Acute pulmonary edema: Secondary | ICD-10-CM

## 2019-02-26 DIAGNOSIS — E43 Unspecified severe protein-calorie malnutrition: Secondary | ICD-10-CM

## 2019-02-26 LAB — CBC
HCT: 39.8 % (ref 36.0–46.0)
Hemoglobin: 11.6 g/dL — ABNORMAL LOW (ref 12.0–15.0)
MCH: 28.2 pg (ref 26.0–34.0)
MCHC: 29.1 g/dL — AB (ref 30.0–36.0)
MCV: 96.8 fL (ref 80.0–100.0)
Platelets: 157 10*3/uL (ref 150–400)
RBC: 4.11 MIL/uL (ref 3.87–5.11)
RDW: 13.1 % (ref 11.5–15.5)
WBC: 6.3 10*3/uL (ref 4.0–10.5)
nRBC: 0 % (ref 0.0–0.2)

## 2019-02-26 LAB — HEPARIN LEVEL (UNFRACTIONATED): Heparin Unfractionated: 0.22 IU/mL — ABNORMAL LOW (ref 0.30–0.70)

## 2019-02-26 MED ORDER — SODIUM CHLORIDE 0.9 % IV SOLN
0.5000 mg/h | INTRAVENOUS | Status: DC
  Administered 2019-02-26: 1 mg/h via INTRAVENOUS
  Administered 2019-02-28: 0.5 mg/h via INTRAVENOUS
  Filled 2019-02-26 (×3): qty 2.5

## 2019-02-26 MED ORDER — HYDROMORPHONE HCL PF 10 MG/ML IJ SOLN
INTRAMUSCULAR | Status: AC
  Filled 2019-02-26: qty 5

## 2019-02-26 MED ORDER — MORPHINE SULFATE (PF) 2 MG/ML IV SOLN
1.0000 mg | INTRAVENOUS | Status: DC | PRN
  Administered 2019-02-26: 1 mg via INTRAVENOUS
  Filled 2019-02-26: qty 1

## 2019-02-26 MED ORDER — MORPHINE 100MG IN NS 100ML (1MG/ML) PREMIX INFUSION: 5.0000 mg/h | INTRAVENOUS | Status: DC

## 2019-02-26 NOTE — Progress Notes (Signed)
Patient had increased WOB RR 39-40 , HR 130 and SATs decreased to 87%. RT placed patient back on BIPAP 16/8 40% FIO2. Patient SATs now 115, RR 30 and SATs 98%. RT will continue to monitor

## 2019-02-26 NOTE — Progress Notes (Signed)
SLP Cancellation Note  Patient Details Name: Sheila Flores MRN: [id number] DOB: 1930/12/17   Cancelled treatment:       Reason Eval/Treat Not Completed: Medical issues which prohibited therapy. SLP attempted to provide ongoing diagnostic dysphagia treatment; Unfortunately Pt continues to require BiPAP. ST will continue efforts and will follow for goals of care.   Amelia H. Roddie Mc, CCC-SLP Speech Language Pathologist    [name] 02/26/2019, 2:35 PM

## 2019-02-26 NOTE — Progress Notes (Signed)
Initial Nutrition Assessment  DOCUMENTATION CODES:   Severe malnutrition in context of acute illness/injury  INTERVENTION:  Dysphagia 1 diet with Nectar liquids -as pt is safe to consume  Provide 100% assistance with all meals-per ST  Magic cups with meals   RD will continue to follow    NUTRITION DIAGNOSIS:   Severe Malnutrition related to (S) acute illness, chronic illness, inability to eat(acute on Chronic COPD exacerbation.. pt unable to consume oral intake due to poor respiratory status.) as evidenced by per patient/family report, meal completion < 25%, mild fat depletion, moderate muscle depletion, severe muscle depletion(Little to no po intake since 2/29- Day 6.).   GOAL:   (Provide nutrition intervention based on pateint/ family care progression decision.)  MONITOR:     REASON FOR ASSESSMENT:   LOS   ASSESSMENT: Patient is an 85 yo female with history of COPD, HTN, Dementia, Dysphagia, Vitamin D deficiency, hypokalemia, basal cell carcinoma. Presently- COPD exacerbation and has been unable to wean from BiPAP.   Talked with daughter in law and patient RN today. At home prior to admission pt eating regular diet 2 meals daily and sweets for snack with her coffee in between.   ST has assessed and MBSS completed. Diet advanced on 3/4 to Pureed textures and nectar thick liquids- requires feeding assistance. No meaningful oral intake documented since 2/29. Intake opportunities limited due to her respiratory failure- and requirement of BiPAP support. Unable to place NGT due to limitations of achieving appropriate seal for respiratory support device. Patient meets criteria for severe malnutrition in acute setting due to inadequate oral intake and moderate to severe muscle loss.   Recommend continued conversations with family (per RN son is coming in this afternoon to talk with Dr Roderic Palau). PEG tube not recommended given her advanced age and dementia disease.   Labs: BMP Latest  Ref Rng & Units 02/25/2019 02/24/2019 02/23/2019  Glucose 70 - 99 mg/dL 120(H) 196(H) 150(H)  BUN 8 - 23 mg/dL 18 15 16   Creatinine 0.44 - 1.00 mg/dL 0.55 0.53 0.53  Sodium 135 - 145 mmol/L 142 142 143  Potassium 3.5 - 5.1 mmol/L 3.4(L) 3.0(L) 3.5  Chloride 98 - 111 mmol/L 98 93(L) 101  CO2 22 - 32 mmol/L 40(H) 36(H) 33(H)  Calcium 8.9 - 10.3 mg/dL 8.6(L) 8.1(L) 8.6(L)    Medications reviewed and include: Lipitor, prednisone, protonix  NUTRITION - FOCUSED PHYSICAL EXAM:    Most Recent Value  Orbital Region  Mild depletion  Upper Arm Region  No depletion  Thoracic and Lumbar Region  Mild depletion  Buccal Region  Unable to assess [BiPAP Head Strap]  Temple Region  Unable to assess  Clavicle Bone Region  Mild depletion  Clavicle and Acromion Bone Region  Moderate depletion  Scapular Bone Region  Unable to assess  Dorsal Hand  Moderate depletion  Anterior Thigh Region  Severe depletion  Posterior Calf Region  Severe depletion  Edema (RD Assessment)  None  Hair  Unable to assess  Eyes  Unable to assess  Mouth  Unable to assess  Skin  Reviewed  Nails  Reviewed     Diet Order:   Diet Order            DIET - DYS 1 Room service appropriate? Yes; Fluid consistency: Nectar Thick  Diet effective now              EDUCATION NEEDS:   Not appropriate for education at this time Skin:  Skin Assessment: Reviewed RN  Assessment  Last BM:  2/28  Height:   Ht Readings from Last 1 Encounters:  02/19/19 5\' 2"  (1.575 m)    Weight:   Wt Readings from Last 1 Encounters:  02/26/19 47.3 kg    Ideal Body Weight:  50 kg  BMI:  Body mass index is 19.07 kg/m.  Estimated Nutritional Needs:   Kcal:  1419-1561 (30-33) kcal/kg/bw)  Protein:  80-85 gr (1.7-1.8 gr/kg/bw)  Fluid:  1.4 liters daily   Colman Cater MS,RD,CSG,LDN Office: [phone] Pager: [phone]

## 2019-02-26 NOTE — Care Management Important Message (Signed)
Important Message  Patient Details  Name: Sheila Flores MRN: [id number] Date of Birth: 03-01-1930   Medicare Important Message Given:  Yes    Nyiesha Beever, Chauncey Reading, RN 02/26/2019, 2:15 PM

## 2019-02-26 NOTE — Progress Notes (Signed)
BiPAP removed per family request, Sheila Flores (patient's POA and son) at bedside with another son and daughter in law, all are in agreement to take the patient's BiPAP off at this time. Family requesting to have a morphine drip ordered so that it can be started after they have had time to speak with her prior to initiation, Dr. Roderic Palau made aware of that request.   Celestia Khat, RN

## 2019-02-26 NOTE — Progress Notes (Addendum)
PROGRESS NOTE   Sheila Flores  TKZ:[id number] DOB: [birth year] DOA: 02/19/2019 PCP: System, Provider Not In    Brief Narrative:  85 year old female brought to the hospital after having a choking episode.  She has a history of COPD, hypertension.  She was noted to be short of breath and wheezing.  She was seen by speech therapy and underwent modified barium swallow.  She was also seen by GI for possible esophageal component of dysphagia.  She was also noted to have an elevated troponin felt to be related to demand ischemia.  Seen by cardiology and treated medically.  Hospital course complicated by development of respiratory acidosis requiring BiPAP therapy.  Assessment & Plan:   Principal Problem:   COPD exacerbation ([place]) Active Problems:   Alzheimer's dementia ([place])   Hypertension   Hyperlipidemia   Hypokalemia   Choking episode   Elevated troponin   Dysphagia   Aspiration into airway   Goals of care, counseling/discussion   Palliative care by specialist   DNR (do not resuscitate) discussion   Acute pulmonary edema ([place])   Encounter for hospice care discussion  1. Acute respiratory failure with hypoxia and hypercarbia  - Pt had acute decompensation (likely from aspiration) and now is on bipap. She was also noted to have pulmonary edema treated with IV lasix.  Follow chest xray and wean bipap as able.  Palliative medicine consult for goals of care discussions.  Pt now is partial code, no intubation, no CPR but bipap and medications are acceptable.  Family still needs ongoing education.  Family member again found removing bipap and giving patient water which we have repeatedly told them that patient aspirates when they do this.  Unfortunately we will need to call security if this happens again for patient's safety.  I have made staff aware.   2. Acute pulmonary edema - Treated with IV lasix ordered by cardiology team.  Pt net negative 4.3L.      3. Dysphagia with aspiration.  Seen by  speech therapy and underwent modified barium swallow study.  It was felt that her aspiration may be more related to esophageal causes.  She was seen by GI not felt to be a candidate for EGD at this time due to cardiac/respiratory issues.  GI has signed off.  She was started on a diet by speech therapy, but continues to have coughing/shortness of breath with p.o. intake.  SLP have recommended feeding only if patient off bipap for 30 mins.   4. COPD exacerbation.  Overall wheezing is better.  Continue bronchodilators.  Weaning steroids.  5. Aspiration pneumonia - treated.  6. Elevated troponin, felt to be secondary to demand ischemia.   Troponin has peaked at 2.24.  Echocardiogram shows normal ejection fraction, although does comment on hypokinetic apex.  Cardiology has seen and signed off.  Currently on medical management.  7. Hypokalemia.  Replace as needed. Follow lytes.   8. Hypertension.  Blood pressure currently stable.  Continue to monitor. 9. Right basal frontal meningioma.  Followed at [facility] clinic. 10. Dementia  DVT prophylaxis: Heparin Code Status: Partial code, no intubation or CPR Family Communication: Discussed with son [name] and updated at the bedside Disposition Plan: continue care in SDU due to [place] for decompensation, pending palliative care discussions, goals of care in progress  Consultants:   Cardiology  GI  Procedures:  EGD: 1. Stage 1: 1: Entire apex is abnormal.  2. The left ventricle has normal systolic function with an ejection fraction  of 60-65%. The cavity size was normal. There is moderate asymmetric left ventricular hypertrophy. Left ventricular diastolic Doppler parameters are consistent with impaired  relaxation Elevated left atrial and left ventricular end-diastolic pressures.  3. The right ventricle has normal systolic function. The cavity was normal. There is no increase in right ventricular wall thickness.  4. Left atrial size was  severely dilated.  5. The mitral valve is normal in structure. There is mild mitral annular calcification present. Mitral valve regurgitation is moderate by color flow Doppler.  6. The tricuspid valve is normal in structure.  7. The aortic valve is tricuspid. There is mild stenosis of the aortic valve. Aortic valve regurgitation is mild by color flow Doppler.   8. The aortic root is normal in size and structure.  Antimicrobials:   Zosyn 2/28>>3/5  Subjective: Patient currently on BiPAP.  Per staff, she received Ativan earlier this morning and is currently somnolent.  She does not open her eyes or answer questions.  Objective: Vitals:   02/26/19 1100 02/26/19 1103 02/26/19 1121 02/26/19 1150  BP: (!) 142/122 (!) 183/102    Pulse: (!) 139 (!) 113 (!) 109   Resp: (!) 29 19 (!) 37   Temp:    97.9 F (36.6 C)  TempSrc:    Axillary  SpO2: 98% 94% 96%   Weight:      Height:        Intake/Output Summary (Last 24 hours) at 02/26/2019 1253 Last data filed at 02/26/2019 0900 Gross per 24 hour  Intake 60 ml  Output 100 ml  Net -40 ml   Filed Weights   02/24/19 0500 02/25/19 0500 02/26/19 0600  Weight: 43.4 kg 48 kg 47.3 kg   Examination:  General exam: somnolent, on bipap Respiratory system: occasional rhonchi. Respiratory effort normal. Cardiovascular system:RRR. No murmurs, rubs, gallops. Gastrointestinal system: Abdomen is nondistended, soft and nontender. No organomegaly or masses felt. Normal bowel sounds heard. Central nervous system:  No focal neurological deficits. Extremities: No C/C/E, +pedal pulses Skin: No rashes, lesions or ulcers Psychiatry: lethargic    Data Reviewed: I have personally reviewed following labs and imaging studies  CBC: Recent Labs  Lab 02/22/19 0441 02/23/19 0430 02/24/19 0423 02/25/19 0452 02/26/19 0419  WBC 9.6 13.6* 8.4 10.8* 6.3  HGB 14.5 14.6 13.3 12.6 11.6*  HCT 45.5 49.2* 45.3 42.7 39.8  MCV 89.9 94.8 95.4 97.5 96.8  PLT 172 212  185 188 175   Basic Metabolic Panel: Recent Labs  Lab 02/20/19 0905 02/22/19 0850 02/23/19 0430 02/24/19 0423 02/25/19 0452  NA 140 140 143 142 142  K 3.5 3.6 3.5 3.0* 3.4*  CL 102 98 101 93* 98  CO2 30 34* 33* 36* 40*  GLUCOSE 129* 164* 150* 196* 120*  BUN 11 12 16 15 18   CREATININE 0.62 0.60 0.53 0.53 0.55  CALCIUM 8.3* 8.3* 8.6* 8.1* 8.6*  MG  --   --   --   --  2.3   GFR: Estimated Creatinine Clearance: 37.7 mL/min (by C-G formula based on SCr of 0.55 mg/dL). Liver Function Tests: No results for input(s): AST, ALT, ALKPHOS, BILITOT, PROT, ALBUMIN in the last 168 hours. No results for input(s): LIPASE, AMYLASE in the last 168 hours. No results for input(s): AMMONIA in the last 168 hours. Coagulation Profile: No results for input(s): INR, PROTIME in the last 168 hours. Cardiac Enzymes: Recent Labs  Lab 02/20/19 0905  TROPONINI 1.91*   BNP (last 3 results) No results for input(s): PROBNP  in the last 8760 hours. HbA1C: No results for input(s): HGBA1C in the last 72 hours. CBG: No results for input(s): GLUCAP in the last 168 hours. Lipid Profile: No results for input(s): CHOL, HDL, LDLCALC, TRIG, CHOLHDL, LDLDIRECT in the last 72 hours. Thyroid Function Tests: No results for input(s): TSH, T4TOTAL, FREET4, T3FREE, THYROIDAB in the last 72 hours. Anemia Panel: No results for input(s): VITAMINB12, FOLATE, FERRITIN, TIBC, IRON, RETICCTPCT in the last 72 hours. Sepsis Labs: No results for input(s): PROCALCITON, LATICACIDVEN in the last 168 hours.  Recent Results (from the past 240 hour(s))  MRSA PCR Screening     Status: Abnormal   Collection Time: 02/20/19  8:30 PM  Result Value Ref Range Status   MRSA by PCR POSITIVE (A) NEGATIVE Final    Comment:        The GeneXpert MRSA Assay (FDA approved for NASAL specimens only), is one component of a comprehensive MRSA colonization surveillance program. It is not intended to diagnose MRSA infection nor to guide  or monitor treatment for MRSA infections. RESULT CALLED TO, READ BACK BY AND VERIFIED WITH: LANDER AT [id number] ON 0942 BY MOSLEY,J Performed at [facility], [address] [place], [place] [address]     Radiology Studies: No results found. Scheduled Meds: . aspirin  81 mg Oral Daily  . atorvastatin  40 mg Oral q1800  . chlorhexidine  15 mL Mouth Rinse BID  . Chlorhexidine Gluconate Cloth  6 each Topical Daily  . Chlorhexidine Gluconate Cloth  6 each Topical Q0600  . heparin  5,000 Units Subcutaneous Q8H  . ipratropium-albuterol  3 mL Nebulization Q6H  . mouth rinse  15 mL Mouth Rinse q12n4p  . methylPREDNISolone (SOLU-MEDROL) injection  40 mg Intravenous Q24H  . mupirocin ointment  1 application Nasal BID  . pantoprazole  40 mg Oral QAC breakfast  . sodium chloride flush  10-40 mL Intracatheter Q12H  . sodium chloride flush  3 mL Intravenous Q12H   Continuous Infusions: . sodium chloride 250 mL (02/25/19 0244)    LOS: 8 days   Critical Care Time spent: 91 mins  Kathie Dike, MD [facility]  02/26/2019, 12:53 PM   Addendum: 18:00:  Family meeting was had with both the patient's sons and her daughter-in-law.  Her son [name], who is a healthcare power of attorney was present.  He expressed understanding of the patient's current condition and expected prognosis.  He wishes to focus the patient's care on comfort.  He was agreeable to a morphine/Dilaudid infusion to be titrated for respiratory distress.  He wishes her to be removed from BiPAP.  All are in agreement with this.  They will let us know once they want the BiPAP removed and for the infusion to begin.  They agree to DNR.  We will further simplify her medication regimen to medication strictly related to her comfort.  Anticipate in-hospital death.  [employer]

## 2019-02-26 NOTE — Progress Notes (Signed)
RT removed patient from BIPAP and placed on a 15L HFNC Oxygen. Patient maintaining SATs 92%, HR 121 and RR 36, family at bedside. Patient requesting water;however is NPO at this time. RT will continue to monitor.

## 2019-02-27 NOTE — Progress Notes (Signed)
PROGRESS NOTE   Sheila Flores  WGN:[id number] DOB: [birth year] DOA: 01/25/2019 PCP: System, Provider Not In    Brief Narrative:  85 year old female brought to the hospital after having a choking episode.  She has a history of COPD, hypertension.  She was noted to be short of breath and wheezing.  She was seen by speech therapy and underwent modified barium swallow.  She was also seen by GI for possible esophageal component of dysphagia.  She was also noted to have an elevated troponin felt to be related to demand ischemia.  Seen by cardiology and treated medically.  Hospital course complicated by development of respiratory acidosis requiring BiPAP therapy.  After prolonged course on BiPAP and persistent aspiration with any p.o. intake, family had decided to make the patient DNR and transition towards comfort care.  Assessment & Plan:   Principal Problem:   COPD exacerbation ([place]) Active Problems:   Alzheimer's dementia ([place])   Hypertension   Hyperlipidemia   Hypokalemia   Choking episode   Elevated troponin   Dysphagia   Aspiration into airway   Goals of care, counseling/discussion   Palliative care by specialist   DNR (do not resuscitate) discussion   Acute pulmonary edema ([place])   Encounter for hospice care discussion   Protein-calorie malnutrition, severe  1. Acute respiratory failure with hypoxia and hypercarbia  - Pt had acute decompensation (likely from aspiration) and was placed on BiPAP for prolonged period of time. She was also noted to have pulmonary edema treated with IV lasix.  Palliative medicine consult for goals of care discussions.  Patient is now DNR and family has decided to transition to comfort measures.  She has been placed on Dilaudid infusion to help treat respiratory distress.  Prognosis is very poor, anticipate in-hospital death.   2. Acute pulmonary edema - Treated with IV lasix ordered by cardiology team.  Pt net negative 4.3L.      3. Dysphagia with  aspiration.  Seen by speech therapy and underwent modified barium swallow study.  It was felt that her aspiration may be more related to esophageal causes.  She was seen by GI not felt to be a candidate for EGD at this time due to cardiac/respiratory issues.  GI has signed off.  She was started on a diet by speech therapy, but continues to have coughing/shortness of breath with p.o. intake.  We will continue n.p.o. since any p.o. intake causes her to cough, wheeze and become short of breath. 4. COPD exacerbation.  Overall wheezing is better.  Discontinue further steroids and use bronchodilators as needed.  5. Aspiration pneumonia - treated.  6. Elevated troponin, felt to be secondary to demand ischemia.   Troponin has peaked at 2.24.  Echocardiogram shows normal ejection fraction, although does comment on hypokinetic apex.  Cardiology has seen and signed off.  Patient was treated medically 7. Hypokalemia.  Replace as needed. Follow lytes.   8. Hypertension.  Blood pressure currently stable.  Continue to monitor. 9. Right basal frontal meningioma.  Followed at [facility] neurosurgery clinic. 10. Dementia  DVT prophylaxis: None Code Status: DNR Family Communication: Discussed with son, [name] at the bedside Disposition Plan: If patient demonstrates some degree of stability over the weekend, can consider discussions about residential hospice.  Otherwise we will continue current treatments and anticipate in-hospital death.  Consultants:   Cardiology  GI  Procedures:  EGD: 1. Stage 1: 1: Entire apex is abnormal.  2. The left ventricle has normal systolic function with an  ejection fraction of 60-65%. The cavity size was normal. There is moderate asymmetric left ventricular hypertrophy. Left ventricular diastolic Doppler parameters are consistent with impaired  relaxation Elevated left atrial and left ventricular end-diastolic pressures.  3. The right ventricle has normal systolic function. The cavity  was normal. There is no increase in right ventricular wall thickness.  4. Left atrial size was severely dilated.  5. The mitral valve is normal in structure. There is mild mitral annular calcification present. Mitral valve regurgitation is moderate by color flow Doppler.  6. The tricuspid valve is normal in structure.  7. The aortic valve is tricuspid. There is mild stenosis of the aortic valve. Aortic valve regurgitation is mild by color flow Doppler.   8. The aortic root is normal in size and structure.  Antimicrobials:   Zosyn 2/28>>3/5  Subjective: Patient slept most of the night.  She has been off BiPAP.  Currently on Dilaudid infusion for respiratory distress.  Objective: Vitals:   02/27/19 1400 02/27/19 1500 02/27/19 1600 02/27/19 1700  BP:      Pulse: 95 (!) 106 (!) 121 (!) 112  Resp: (!) 9 11 16 14   Temp:      TempSrc:      SpO2:  96%  96%  Weight:      Height:        Intake/Output Summary (Last 24 hours) at 02/27/2019 1754 Last data filed at 02/27/2019 1729 Gross per 24 hour  Intake 0 ml  Output 400 ml  Net -400 ml   Filed Weights   02/24/19 0500 02/25/19 0500 02/26/19 0600  Weight: 43.4 kg 48 kg 47.3 kg   Examination:  General exam: Patient is somnolent, does not respond to voice Respiratory system: Clear to auscultation. Respiratory effort normal. Cardiovascular system:RRR. No murmurs, rubs, gallops. Gastrointestinal system: Abdomen is nondistended, soft and nontender. No organomegaly or masses felt. Normal bowel sounds heard.     Data Reviewed: I have personally reviewed following labs and imaging studies  CBC: Recent Labs  Lab 02/22/19 0441 02/23/19 0430 02/24/19 0423 02/25/19 0452 02/26/19 0419  WBC 9.6 13.6* 8.4 10.8* 6.3  HGB 14.5 14.6 13.3 12.6 11.6*  HCT 45.5 49.2* 45.3 42.7 39.8  MCV 89.9 94.8 95.4 97.5 96.8  PLT 172 212 185 188 397   Basic Metabolic Panel: Recent Labs  Lab 02/22/19 0850 02/23/19 0430 02/24/19 0423 02/25/19 0452    NA 140 143 142 142  K 3.6 3.5 3.0* 3.4*  CL 98 101 93* 98  CO2 34* 33* 36* 40*  GLUCOSE 164* 150* 196* 120*  BUN 12 16 15 18   CREATININE 0.60 0.53 0.53 0.55  CALCIUM 8.3* 8.6* 8.1* 8.6*  MG  --   --   --  2.3   GFR: Estimated Creatinine Clearance: 37.7 mL/min (by C-G formula based on SCr of 0.55 mg/dL). Liver Function Tests: No results for input(s): AST, ALT, ALKPHOS, BILITOT, PROT, ALBUMIN in the last 168 hours. No results for input(s): LIPASE, AMYLASE in the last 168 hours. No results for input(s): AMMONIA in the last 168 hours. Coagulation Profile: No results for input(s): INR, PROTIME in the last 168 hours. Cardiac Enzymes: No results for input(s): CKTOTAL, CKMB, CKMBINDEX, TROPONINI in the last 168 hours. BNP (last 3 results) No results for input(s): PROBNP in the last 8760 hours. HbA1C: No results for input(s): HGBA1C in the last 72 hours. CBG: No results for input(s): GLUCAP in the last 168 hours. Lipid Profile: No results for input(s): CHOL, HDL,  LDLCALC, TRIG, CHOLHDL, LDLDIRECT in the last 72 hours. Thyroid Function Tests: No results for input(s): TSH, T4TOTAL, FREET4, T3FREE, THYROIDAB in the last 72 hours. Anemia Panel: No results for input(s): VITAMINB12, FOLATE, FERRITIN, TIBC, IRON, RETICCTPCT in the last 72 hours. Sepsis Labs: No results for input(s): PROCALCITON, LATICACIDVEN in the last 168 hours.  Recent Results (from the past 240 hour(s))  MRSA PCR Screening     Status: Abnormal   Collection Time: 02/20/19  8:30 PM  Result Value Ref Range Status   MRSA by PCR POSITIVE (A) NEGATIVE Final    Comment:        The GeneXpert MRSA Assay (FDA approved for NASAL specimens only), is one component of a comprehensive MRSA colonization surveillance program. It is not intended to diagnose MRSA infection nor to guide or monitor treatment for MRSA infections. RESULT CALLED TO, READ BACK BY AND VERIFIED WITH: LANDER AT [phone] ON 0942 BY MOSLEY,J Performed at  [facility], [address] [place], [place] [address]     Radiology Studies: No results found. Scheduled Meds: . chlorhexidine  15 mL Mouth Rinse BID  . Chlorhexidine Gluconate Cloth  6 each Topical Daily  . Chlorhexidine Gluconate Cloth  6 each Topical Q0600  . mouth rinse  15 mL Mouth Rinse q12n4p  . mupirocin ointment  1 application Nasal BID  . sodium chloride flush  10-40 mL Intracatheter Q12H  . sodium chloride flush  3 mL Intravenous Q12H   Continuous Infusions: . sodium chloride 250 mL (02/25/19 0244)  . HYDROmorphone 1 mg/hr (02/27/19 1737)    LOS: 9 days   Time spent: 20 mins  Kathie Dike, MD [facility]  02/27/2019, 5:54 PM

## 2019-02-27 NOTE — Progress Notes (Signed)
Patient on morphine drip nebs have been changed to PRN

## 2019-03-24 NOTE — Discharge Summary (Signed)
Death Summary  Phelan Goers KKX:[id number] DOB: 27-Jul-1930 DOA: 03/08/19  PCP: System, Provider Not In  Admit date: 2019/03/08 Date of Death: March 18, 2019 Time of Death: 14:03  History of present illness:  Omunique Pederson is a 85 y.o. female with a history of COPD, dysphagia with intermittent aspiration, was admitted to the hospital with acute respiratory failure with hypoxia after she had been eating dinner.  Patient aspirated and began to choke.  She became hypoxic and was brought to the ER for evaluation.  Imaging indicated possible pneumonia and she was started on antibiotics.  She was also treated for COPD exacerbation with steroids, and bronchodilators.  Unfortunately, her dysphasia appeared to be quite severe.  She was seen by speech therapy.  It was noted that every time she would try to eat or drink something, she would have a coughing spell, become hypoxic and cyanotic.  Hospital course was complicated by development of hypercapnia requiring BiPAP therapy.  She was transferred to stepdown unit.  Patient was on BiPAP for a prolonged period of time.  Every time BiPAP was removed, patient would become hypoxic, tachycardic and had increased work of breathing.  She was seen by palliative care and discussions were had with family regarding goals of care.  Eventually, family did agree to make the patient DNR and she was transitioned to hospice.  She was placed on Dilaudid infusion for respiratory distress after BiPAP was removed.  On 2023/03/18, patient was found without heart sounds or respirations and was pronounced dead.  Family was provided support.  Final Diagnoses:  Principal Problem:   COPD exacerbation ([place]) Active Problems:   Alzheimer's dementia ([place])   Hypertension   Hyperlipidemia   Hypokalemia   Choking episode   Elevated troponin   Dysphagia   Aspiration into airway   Goals of care, counseling/discussion   Palliative care by specialist   DNR (do not resuscitate) discussion  Acute pulmonary edema ([place])   Encounter for hospice care discussion   Protein-calorie malnutrition, severe    The results of significant diagnostics from this hospitalization (including imaging, microbiology, ancillary and laboratory) are listed below for reference.    Significant Diagnostic Studies: Dg Chest Port 1 View  Result Date: 02/23/2019 CLINICAL DATA:  Pulmonary edema. EXAM: PORTABLE CHEST 1 VIEW COMPARISON:  02/22/2019 FINDINGS: The patient is again rotated to the right. The cardiac silhouette is mildly enlarged. Patchy left greater than right basilar airspace opacities are unchanged. There may be a small persistent left pleural effusion. No pneumothorax is identified. IMPRESSION: Unchanged left greater than right basilar airspace disease and possible small left pleural effusion. Electronically Signed   By: Logan Bores M.D.   On: 02/23/2019 07:44   Dg Chest Port 1 View  Result Date: 02/22/2019 CLINICAL DATA:  Acute pulmonary edema. EXAM: PORTABLE CHEST 1 VIEW COMPARISON:  Radiographs March 08, 2019 and 02/20/2019. FINDINGS: 0813 hours. Patient is rotated to the right. The heart size and mediastinal contours are stable. There is mild cardiomegaly and aortic atherosclerosis. There has been partial clearing of the bibasilar airspace opacity seen on the most recent examination. There are possible small bilateral pleural effusions and mild vascular congestion, but no overt pulmonary edema or pneumothorax. No acute osseous findings are evident. There is a thoracolumbar scoliosis. Multiple telemetry leads overlie the chest. IMPRESSION: Partial clearing of bibasilar airspace opacities, consistent with improving edema or pneumonia. Electronically Signed   By: Richardean Sale M.D.   On: 02/22/2019 08:33   Dg Chest Port 1 View  Result Date:  02/20/2019 CLINICAL DATA:  Per RN- Resp failure EXAM: PORTABLE CHEST 1 VIEW COMPARISON:  02/01/2019 FINDINGS: The heart is enlarged but partially obscured by  parenchymal opacities in the lung bases. There are patchy airspace filling opacities bilaterally, significantly increased compared with recent exam. There is a basilar predominance with partial obscuration of the hemidiaphragms. IMPRESSION: Significant interval increase in bilateral airspace filling opacities, consistent pulmonary edema or infectious process. Cardiomegaly. Electronically Signed   By: Nolon Nations M.D.   On: 02/20/2019 20:31   Dg Chest Portable 1 View  Result Date: 01/27/2019 CLINICAL DATA:  Shortness of breath EXAM: PORTABLE CHEST 1 VIEW COMPARISON:  None. FINDINGS: There is hyperinflation of the lungs compatible with COPD. Mild cardiomegaly. No confluent airspace opacities, effusions or edema. No acute bony abnormality. IMPRESSION: COPD, cardiomegaly.  No active disease. Electronically Signed   By: Rolm Baptise M.D.   On: 02/19/2019 21:56   Dg Swallowing Func-speech Pathology  Result Date: 02/19/2019 Objective Swallowing Evaluation: Type of Study: MBS-Modified Barium Swallow Study  Patient Details Name: Raylene Carmickle MRN: [id number] Date of Birth: [birth year] Today's Date: 02/19/2019 Time: SLP Start Time (ACUTE ONLY): 1157 -SLP Stop Time (ACUTE ONLY): 1223 SLP Time Calculation (min) (ACUTE ONLY): 26 min Past Medical History: Past Medical History: Diagnosis Date . Alzheimer's dementia ([place])  . Asthma  . Blindness of left eye  . Cancer ([place])   basal cell carncinoma . COPD (chronic obstructive pulmonary disease) ([place])  . Depression  . Diverticulosis  . Dysphagia  . Glaucoma  . Hyperlipidemia  . Hypertension  . Insomnia  . Vitamin D deficiency  Past Surgical History: Past Surgical History: Procedure Laterality Date . ABDOMINAL HYSTERECTOMY   . CATARACT EXTRACTION   . CHOLECYSTECTOMY   . LUNG REMOVAL, PARTIAL Left 1960 . TOTAL HIP ARTHROPLASTY Left  HPI: Lacosta Hargan is a 85 y.o. female with medical history significant of dementia, COPD not on home oxygen supplementation, asthma comes in  with son after she choked on some spaghetti they think.  Patient sometimes coughs and chokes a lot when she is eating and drinking.  She does have COPD but does not require home oxygen supplementation.  After the choking episode and coughing she was very short of breath so they brought her the emergency department.  The emergency department she was found to be hypoxic was placed on 3 L of oxygen with good O2 sats.  She was weak wheezing diffusely.  Patient is being referred for admission for COPD exacerbation.  As I am walking in the room to do her evaluation she again clearly choked while swallowing 1 of her pills provided to her by the emergency room nurse.  No data recorded Assessment / Plan / Recommendation CHL IP CLINICAL IMPRESSIONS 02/19/2019 Clinical Impression Pt presents with mild pharyngeal dysphagia and suspected esophageal dysphagia; mild pharyngeal residue noted with all trials that was cleared with additional dry swallows (residue is likely age related change/normal difference), and towards the end of the study note penetration of thin liquids to the level of the cords that was noted to sit on the cords after swallowing- sensation was volume dependent. Pt does have a strong reflexive cough that was effective in clearing penetrates when cued to cough or with reflexive cough; one episode of trace aspiration of thin liquids was visualized and cleared by reflexive cough. Brief stasis of the barium tablet was noted in the pyriforms with Pt reporting "that didn't go down" and continuing to talk while pill sat in the pharyngeal  space; Pill passed through the pharyngeal space with additional sips of thin liquids. Esophageal sweep reveals stasis of tablet in the medial/distal esophagus and to and fro movement of liquids in the esophagus however no retrograde movement was noted to or above the level of the cricopharyngus, however, no radiologist present to confirm esophageal observations. With multiple  consecutive sips of thin liquids, bites of puree and sips of NTL, barium tablet was not visualized passing through the esophagus. Unfortunately, despite Pt being pressed by heavy trials during this test she did not demonstrate a "choking episode" as she reportedly has frequently. Question if these episodes are related to possible esophageal component of swallowing. Recommend initiate D3/mech soft diet and thin liquids; Recommend meds to be adminsitered crushed with puree. Recommend precautions with PO: small bites/sips, alternate bites and sips, esophageal precautions including: small meals, when experiencing globus sensation Pt should do multiple dry swallows and take small bites of puree to facilitate clearance. Recommend consider GI consult. ST will continue to follow for diet tolerance and compliance with/implementation of compensatory strategies.  SLP Visit Diagnosis Dysphagia, unspecified (R13.10) Attention and concentration deficit following -- Frontal lobe and executive function deficit following -- Impact on safety and function Mild aspiration risk;Moderate aspiration risk   CHL IP TREATMENT RECOMMENDATION 02/19/2019 Treatment Recommendations Therapy as outlined in treatment plan below   Prognosis 02/19/2019 Prognosis for Safe Diet Advancement Good Barriers to Reach Goals -- Barriers/Prognosis Comment -- CHL IP DIET RECOMMENDATION 02/19/2019 SLP Diet Recommendations Dysphagia 3 (Mech soft) solids;Thin liquid Liquid Administration via Cup;Straw Medication Administration Crushed with puree Compensations Minimize environmental distractions;Small sips/bites;Follow solids with liquid;Multiple dry swallows after each bite/sip;Slow rate Postural Changes Remain semi-upright after after feeds/meals (Comment);Seated upright at 90 degrees   CHL IP OTHER RECOMMENDATIONS 02/19/2019 Recommended Consults Consider GI evaluation Oral Care Recommendations Oral care BID Other Recommendations --   No flowsheet data found.  CHL IP  FREQUENCY AND DURATION 02/19/2019 Speech Therapy Frequency (ACUTE ONLY) min 1 x/week Treatment Duration 1 week      CHL IP ORAL PHASE 02/19/2019 Oral Phase WFL Oral - Pudding Teaspoon -- Oral - Pudding Cup -- Oral - Honey Teaspoon -- Oral - Honey Cup -- Oral - Nectar Teaspoon -- Oral - Nectar Cup -- Oral - Nectar Straw -- Oral - Thin Teaspoon -- Oral - Thin Cup -- Oral - Thin Straw -- Oral - Puree -- Oral - Mech Soft -- Oral - Regular -- Oral - Multi-Consistency -- Oral - Pill -- Oral Phase - Comment --  CHL IP PHARYNGEAL PHASE 02/19/2019 Pharyngeal Phase Impaired Pharyngeal- Pudding Teaspoon -- Pharyngeal -- Pharyngeal- Pudding Cup -- Pharyngeal -- Pharyngeal- Honey Teaspoon -- Pharyngeal -- Pharyngeal- Honey Cup -- Pharyngeal -- Pharyngeal- Nectar Teaspoon -- Pharyngeal -- Pharyngeal- Nectar Cup -- Pharyngeal -- Pharyngeal- Nectar Straw -- Pharyngeal -- Pharyngeal- Thin Teaspoon -- Pharyngeal -- Pharyngeal- Thin Cup Pharyngeal residue - valleculae;Pharyngeal residue - pyriform;Delayed swallow initiation-pyriform sinuses Pharyngeal -- Pharyngeal- Thin Straw Pharyngeal residue - valleculae;Pharyngeal residue - pyriform;Penetration/Aspiration during swallow;Penetration/Apiration after swallow Pharyngeal Material enters airway, passes BELOW cords then ejected out;Material enters airway, CONTACTS cords and then ejected out;Material enters airway, remains ABOVE vocal cords then ejected out Pharyngeal- Puree Pharyngeal residue - valleculae;Pharyngeal residue - pyriform Pharyngeal -- Pharyngeal- Mechanical Soft -- Pharyngeal -- Pharyngeal- Regular Pharyngeal residue - valleculae;Pharyngeal residue - pyriform Pharyngeal -- Pharyngeal- Multi-consistency -- Pharyngeal -- Pharyngeal- Pill Pharyngeal residue - pyriform Pharyngeal -- Pharyngeal Comment --  CHL IP CERVICAL ESOPHAGEAL PHASE 02/19/2019 Cervical Esophageal Phase Impaired  Pudding Teaspoon -- Pudding Cup -- Honey Teaspoon -- Honey Cup -- Nectar Teaspoon -- Nectar Cup --  Nectar Straw -- Thin Teaspoon -- Thin Cup -- Thin Straw -- Puree -- Mechanical Soft -- Regular -- Multi-consistency -- Pill -- Cervical Esophageal Comment -- Amelia H. Roddie Mc, CCC-SLP Speech Language Pathologist [name] 02/19/2019, 2:12 PM              [place] Ekg Site Rite  Result Date: 02/23/2019 If Site Rite image not attached, placement could not be confirmed due to current cardiac rhythm.   Microbiology: Recent Results (from the past 240 hour(s))  MRSA PCR Screening     Status: Abnormal   Collection Time: 02/20/19  8:30 PM  Result Value Ref Range Status   MRSA by PCR POSITIVE (A) NEGATIVE Final    Comment:        The GeneXpert MRSA Assay (FDA approved for NASAL specimens only), is one component of a comprehensive MRSA colonization surveillance program. It is not intended to diagnose MRSA infection nor to guide or monitor treatment for MRSA infections. RESULT CALLED TO, READ BACK BY AND VERIFIED WITH: LANDER AT [phone] ON 0942 BY MOSLEY,J Performed at [facility], [address] [place], [place] [address]      Labs: Basic Metabolic Panel: Recent Labs  Lab 02/22/19 0850 02/23/19 0430 02/24/19 0423 02/25/19 0452  NA 140 143 142 142  K 3.6 3.5 3.0* 3.4*  CL 98 101 93* 98  CO2 34* 33* 36* 40*  GLUCOSE 164* 150* 196* 120*  BUN 12 16 15 18   CREATININE 0.60 0.53 0.53 0.55  CALCIUM 8.3* 8.6* 8.1* 8.6*  MG  --   --   --  2.3   Liver Function Tests: No results for input(s): AST, ALT, ALKPHOS, BILITOT, PROT, ALBUMIN in the last 168 hours. No results for input(s): LIPASE, AMYLASE in the last 168 hours. No results for input(s): AMMONIA in the last 168 hours. CBC: Recent Labs  Lab 02/22/19 0441 02/23/19 0430 02/24/19 0423 02/25/19 0452 02/26/19 0419  WBC 9.6 13.6* 8.4 10.8* 6.3  HGB 14.5 14.6 13.3 12.6 11.6*  HCT 45.5 49.2* 45.3 42.7 39.8  MCV 89.9 94.8 95.4 97.5 96.8  PLT 172 212 185 188 157   Cardiac Enzymes: No results for input(s): CKTOTAL, CKMB,  CKMBINDEX, TROPONINI in the last 168 hours. D-Dimer No results for input(s): DDIMER in the last 72 hours. BNP: Invalid input(s): POCBNP CBG: No results for input(s): GLUCAP in the last 168 hours. Anemia work up No results for input(s): VITAMINB12, FOLATE, FERRITIN, TIBC, IRON, RETICCTPCT in the last 72 hours. Urinalysis No results found for: COLORURINE, APPEARANCEUR, LABSPEC, [place], GLUCOSEU, [place], BILIRUBINUR, KETONESUR, PROTEINUR, UROBILINOGEN, NITRITE, LEUKOCYTESUR Sepsis Labs Invalid input(s): PROCALCITONIN,  WBC,  LACTICIDVEN     SIGNED:  Kathie Dike, MD  [facility] 2019-03-14, 8:09 PM Pager   If 7PM-7AM, please contact night-coverage [email or web address] Password [email or web address]

## 2019-03-24 DEATH — deceased

## 2019-04-15 ENCOUNTER — Ambulatory Visit: Payer: Medicare HMO | Admitting: Nurse Practitioner

## 2020-03-29 IMAGING — CR DG CHEST 1V PORT
2 series · 2 of 2 positions shown · non-contrast
Comparison: None.

CLINICAL DATA: Shortness of breath

EXAM:
PORTABLE CHEST 1 VIEW

[portable (1 of 2)]
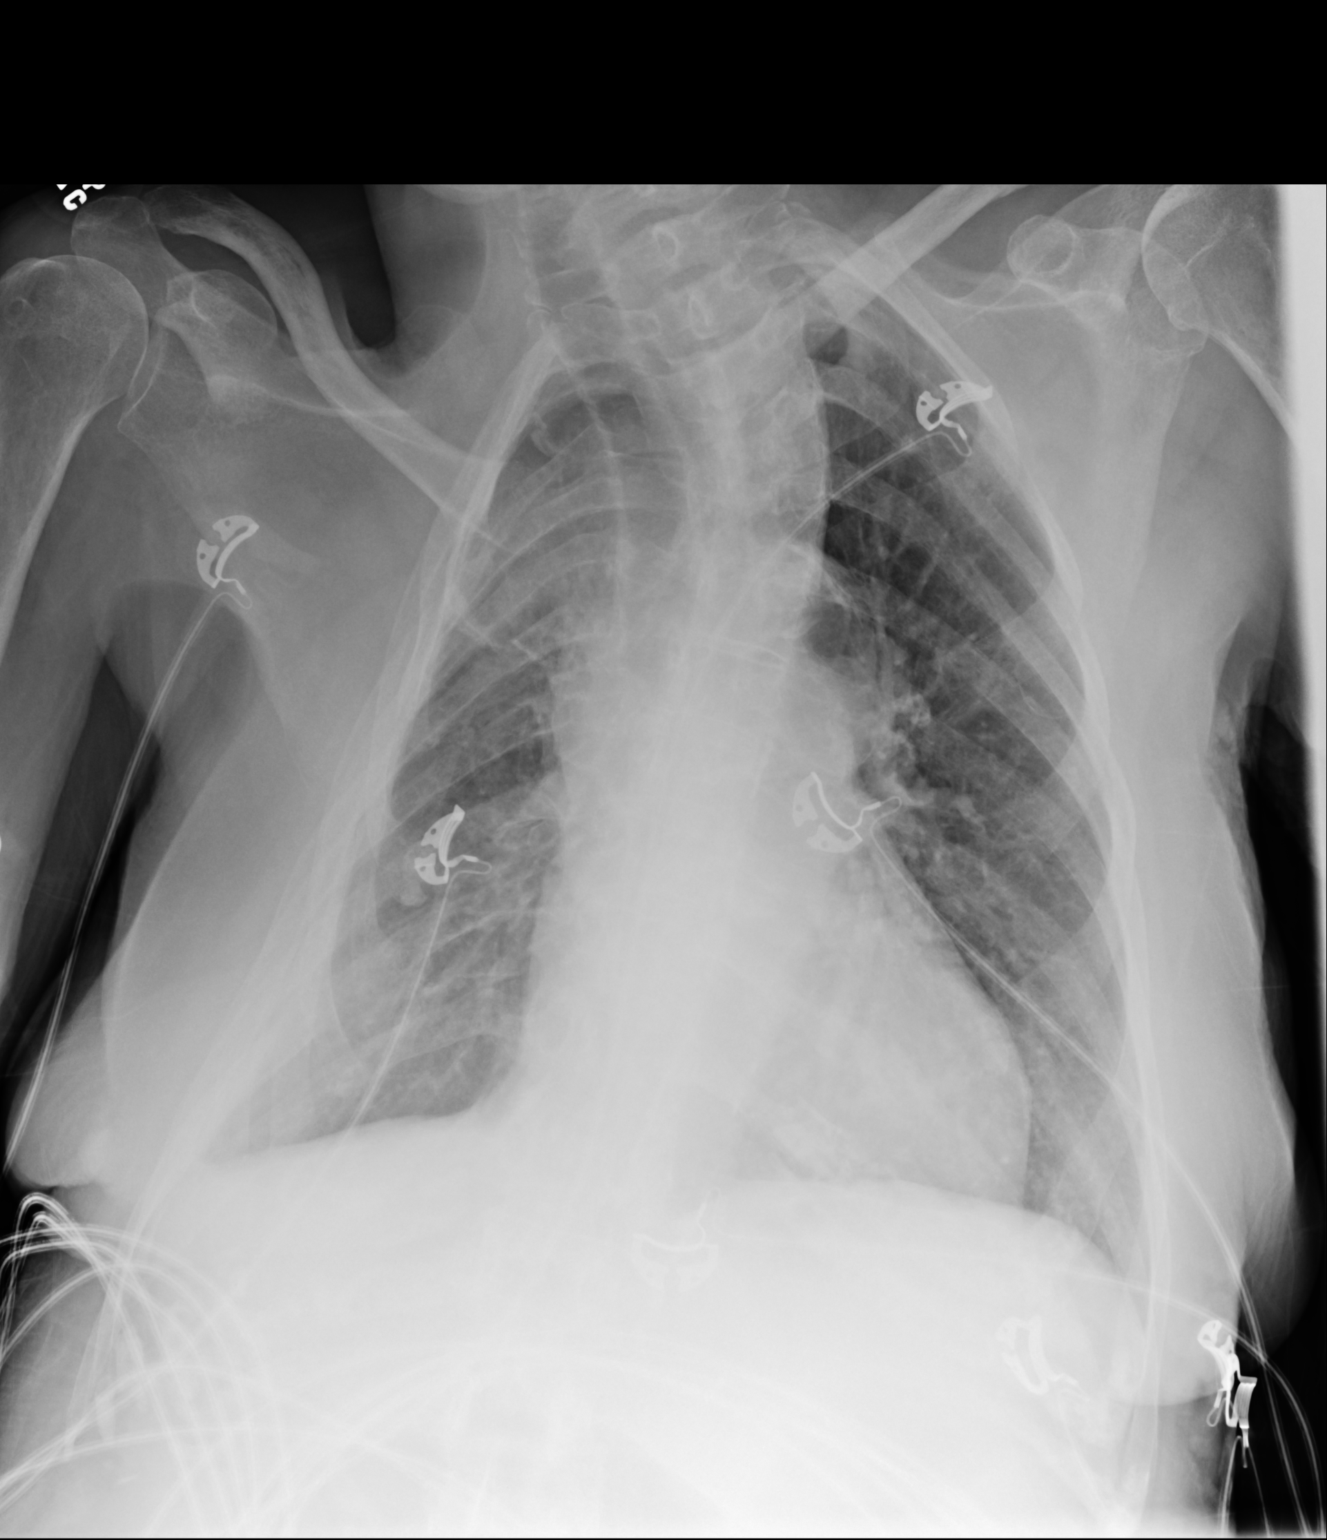

[portable (2 of 2)]
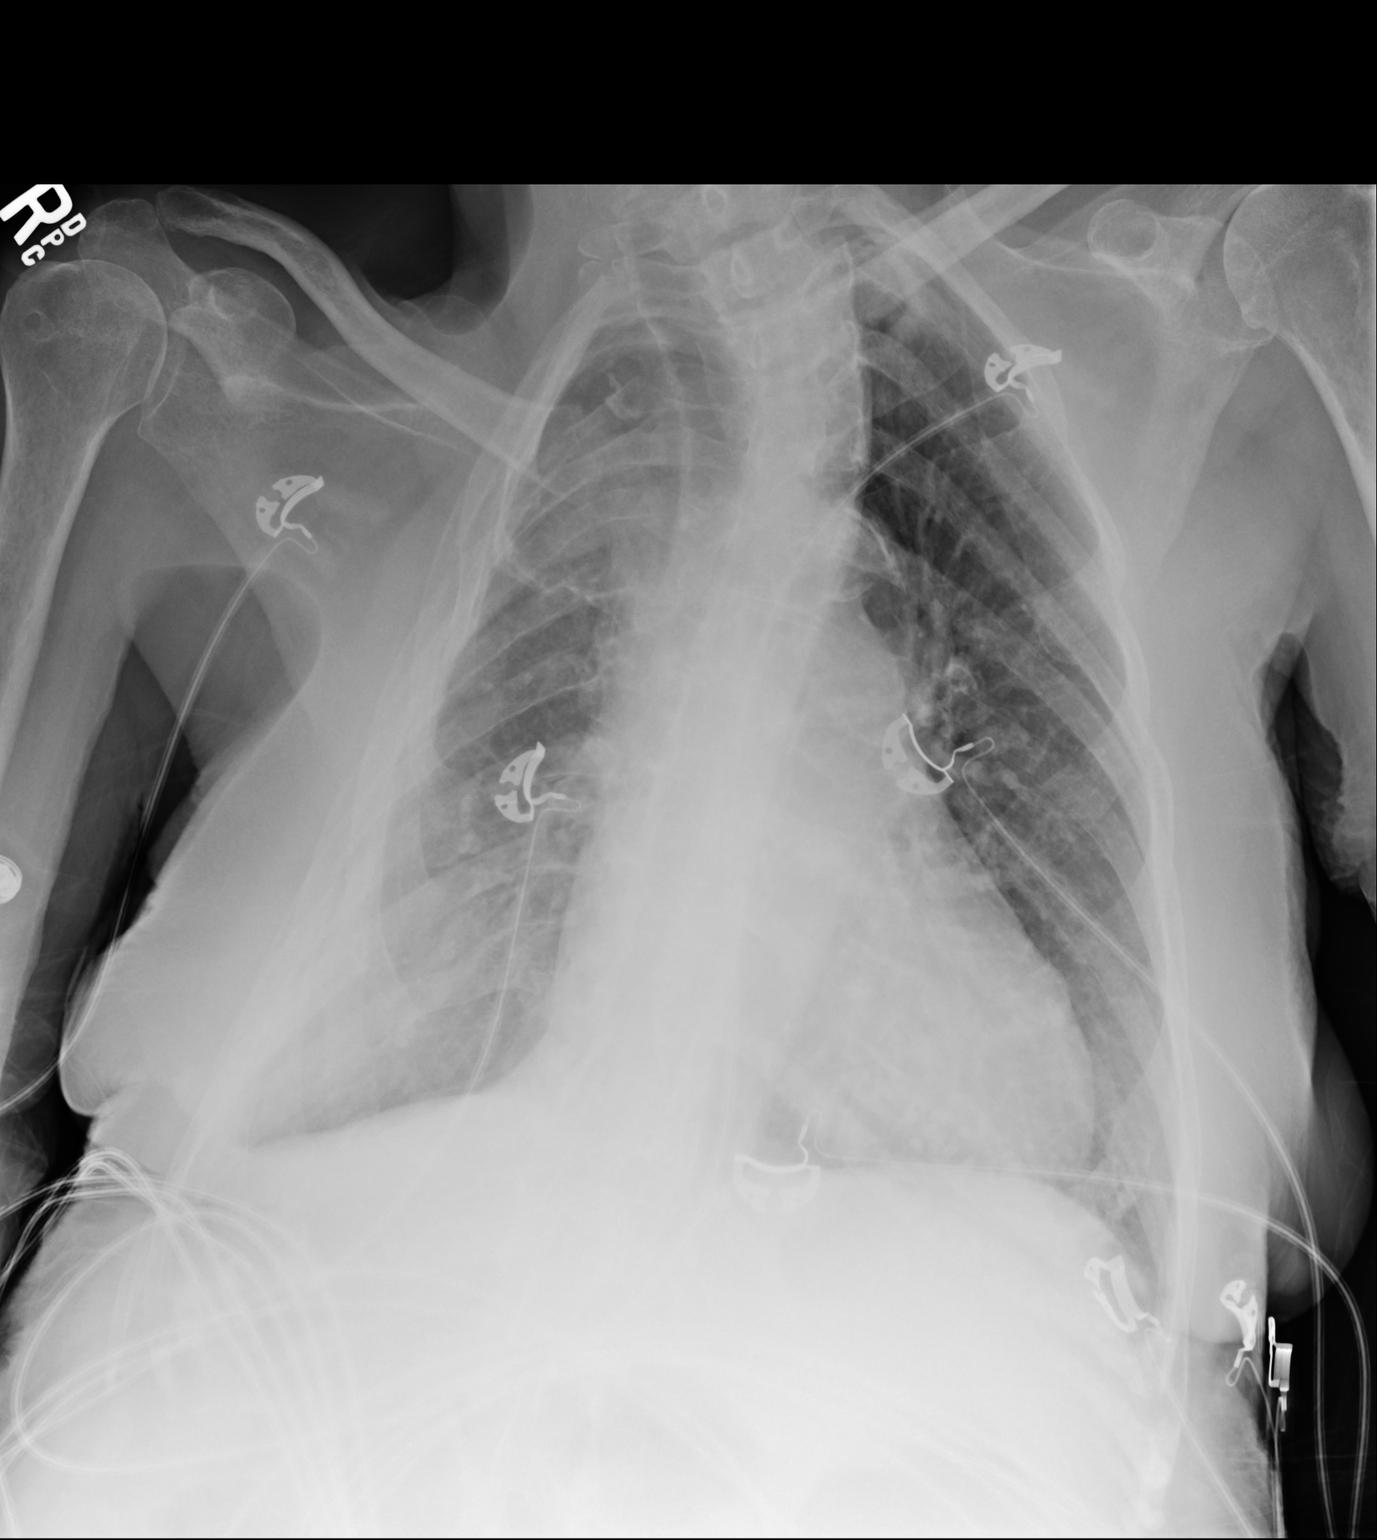

[2 of 2 positions shown; findings below may reference images not displayed]

FINDINGS: There is hyperinflation of the lungs compatible with COPD. Mild
cardiomegaly. No confluent airspace opacities, effusions or edema.
No acute bony abnormality.
IMPRESSION: COPD, cardiomegaly.  No active disease.

## 2020-03-31 IMAGING — CR DG CHEST 1V PORT
1 series · 1 of 1 positions shown · non-contrast
Comparison: 02/18/2019

CLINICAL DATA: Per RN- Resp failure

EXAM:
PORTABLE CHEST 1 VIEW

[portable]
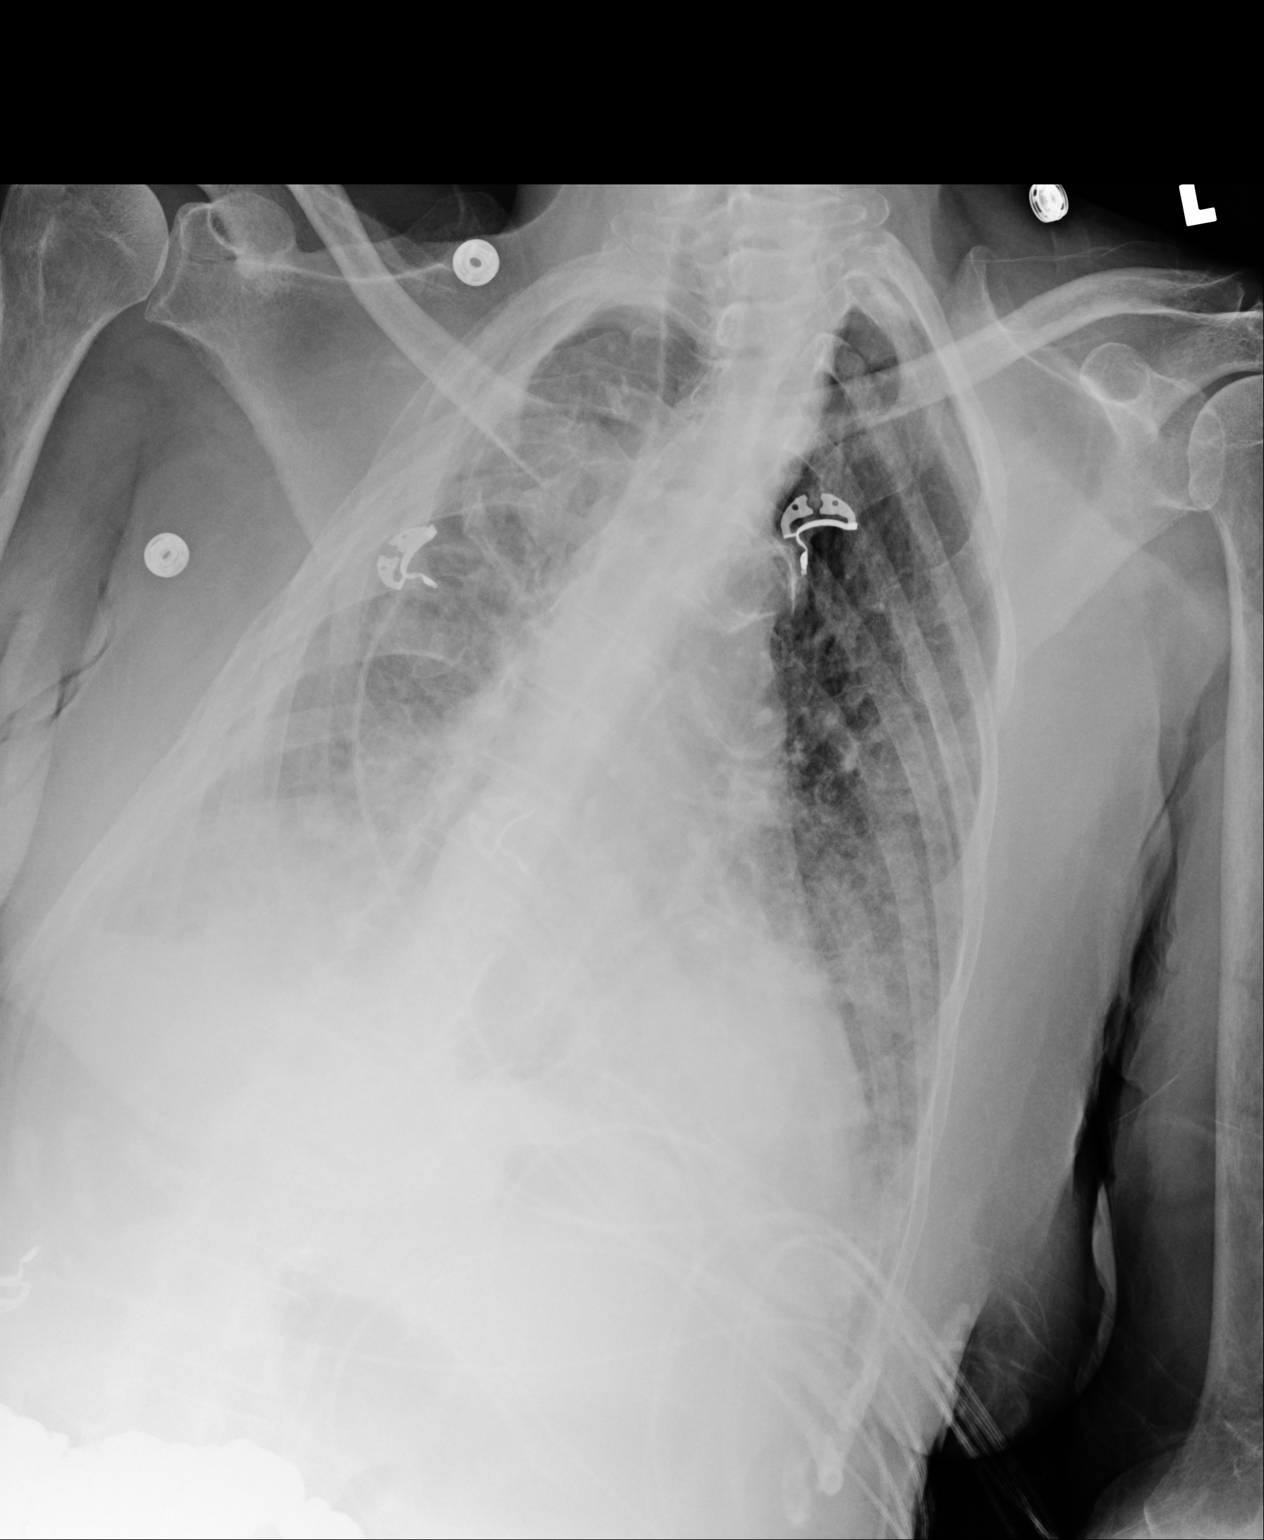

[1 of 1 positions shown; findings below may reference images not displayed]

FINDINGS: The heart is enlarged but partially obscured by parenchymal
opacities in the lung bases. There are patchy airspace filling
opacities bilaterally, significantly increased compared with recent
exam. There is a basilar predominance with partial obscuration of
the hemidiaphragms.
IMPRESSION: Significant interval increase in bilateral airspace filling
opacities, consistent pulmonary edema or infectious process.

Cardiomegaly.

## 2020-04-02 IMAGING — CR DG CHEST 1V PORT
1 series · 1 of 1 positions shown · non-contrast
Comparison: Radiographs 02/18/2019 and 02/20/2019.

CLINICAL DATA: Acute pulmonary edema.

EXAM:
PORTABLE CHEST 1 VIEW

[portable]
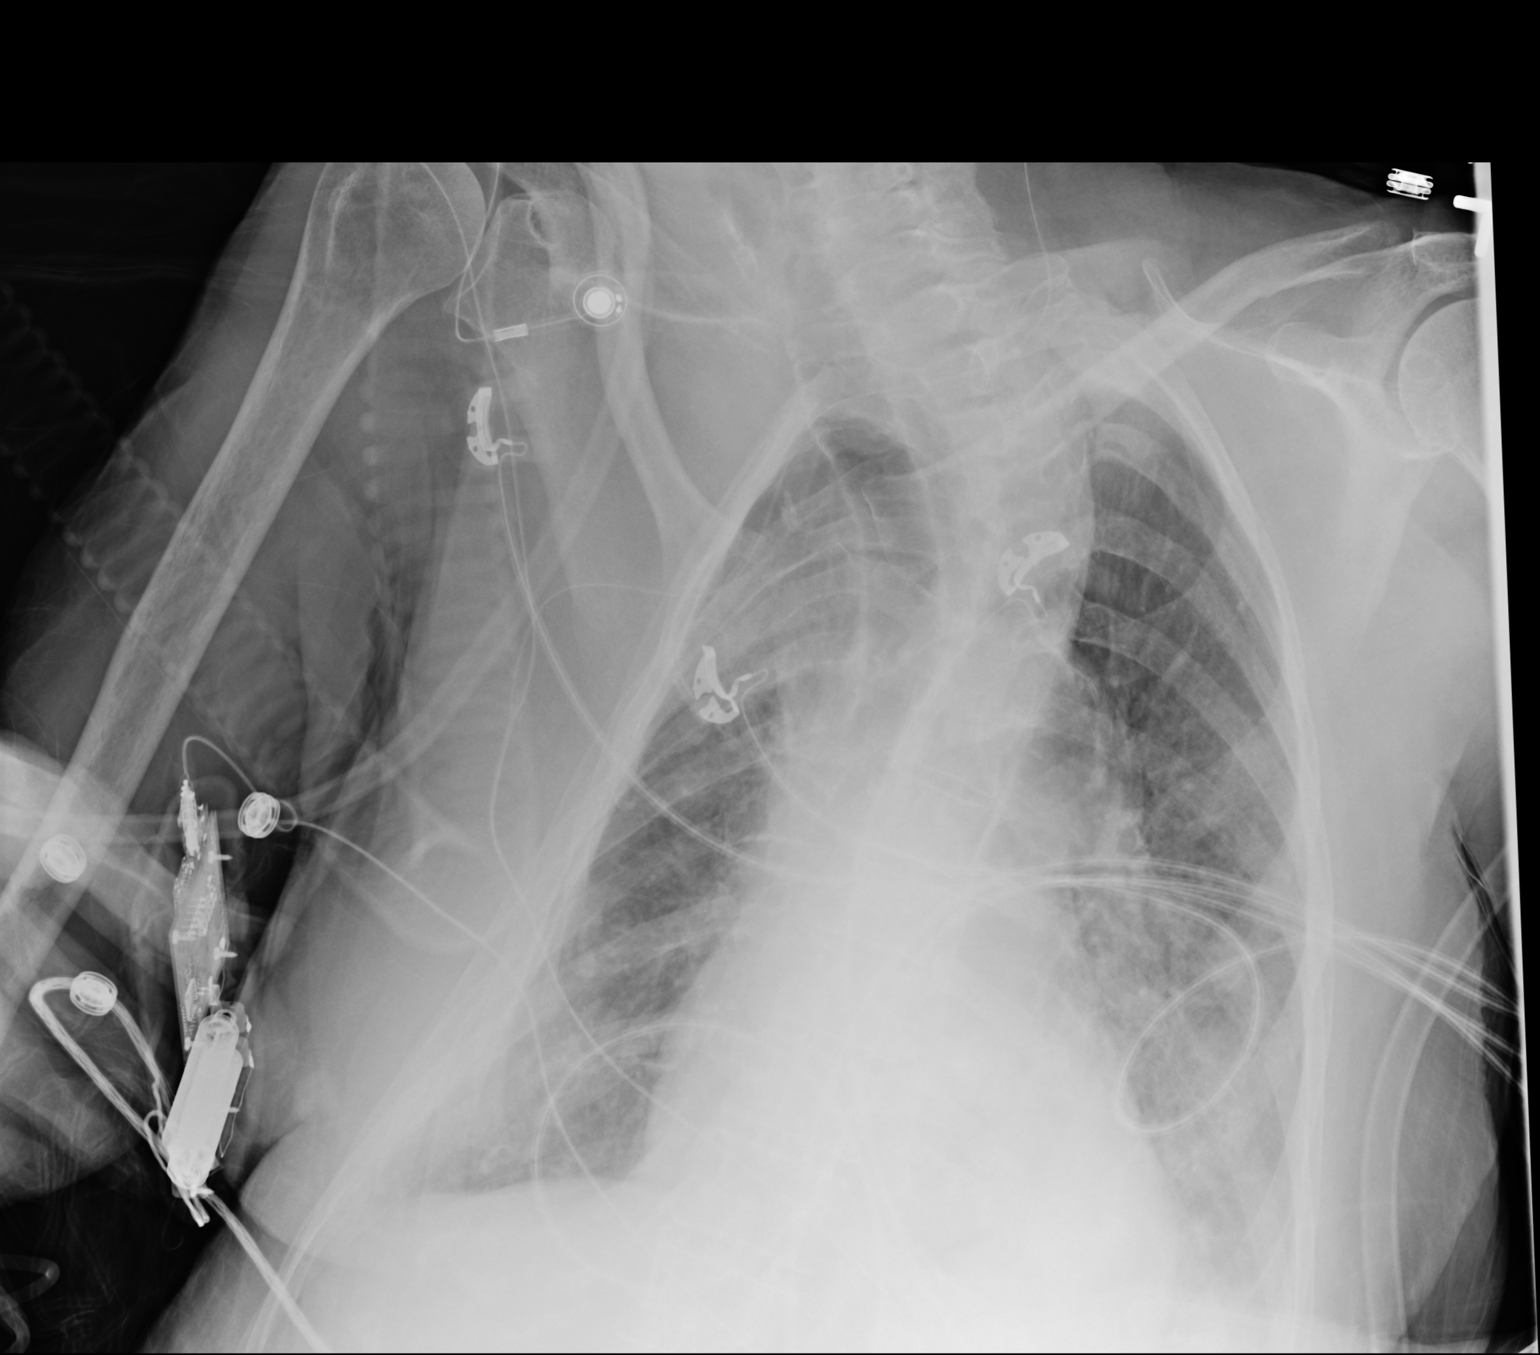

[1 of 1 positions shown; findings below may reference images not displayed]

FINDINGS: [id number] hours. Patient is rotated to the right. The heart size and
mediastinal contours are stable. There is mild cardiomegaly and
aortic atherosclerosis. There has been partial clearing of the
bibasilar airspace opacity seen on the most recent examination.
There are possible small bilateral pleural effusions and mild
vascular congestion, but no overt pulmonary edema or pneumothorax.
No acute osseous findings are evident. There is a thoracolumbar
scoliosis. Multiple telemetry leads overlie the chest.
IMPRESSION: Partial clearing of bibasilar airspace opacities, consistent with
improving edema or pneumonia.

## 2020-04-03 IMAGING — CR DG CHEST 1V PORT
1 series · 1 of 1 positions shown · non-contrast
Comparison: 02/22/2019

CLINICAL DATA: Pulmonary edema.

EXAM:
PORTABLE CHEST 1 VIEW

[portable]
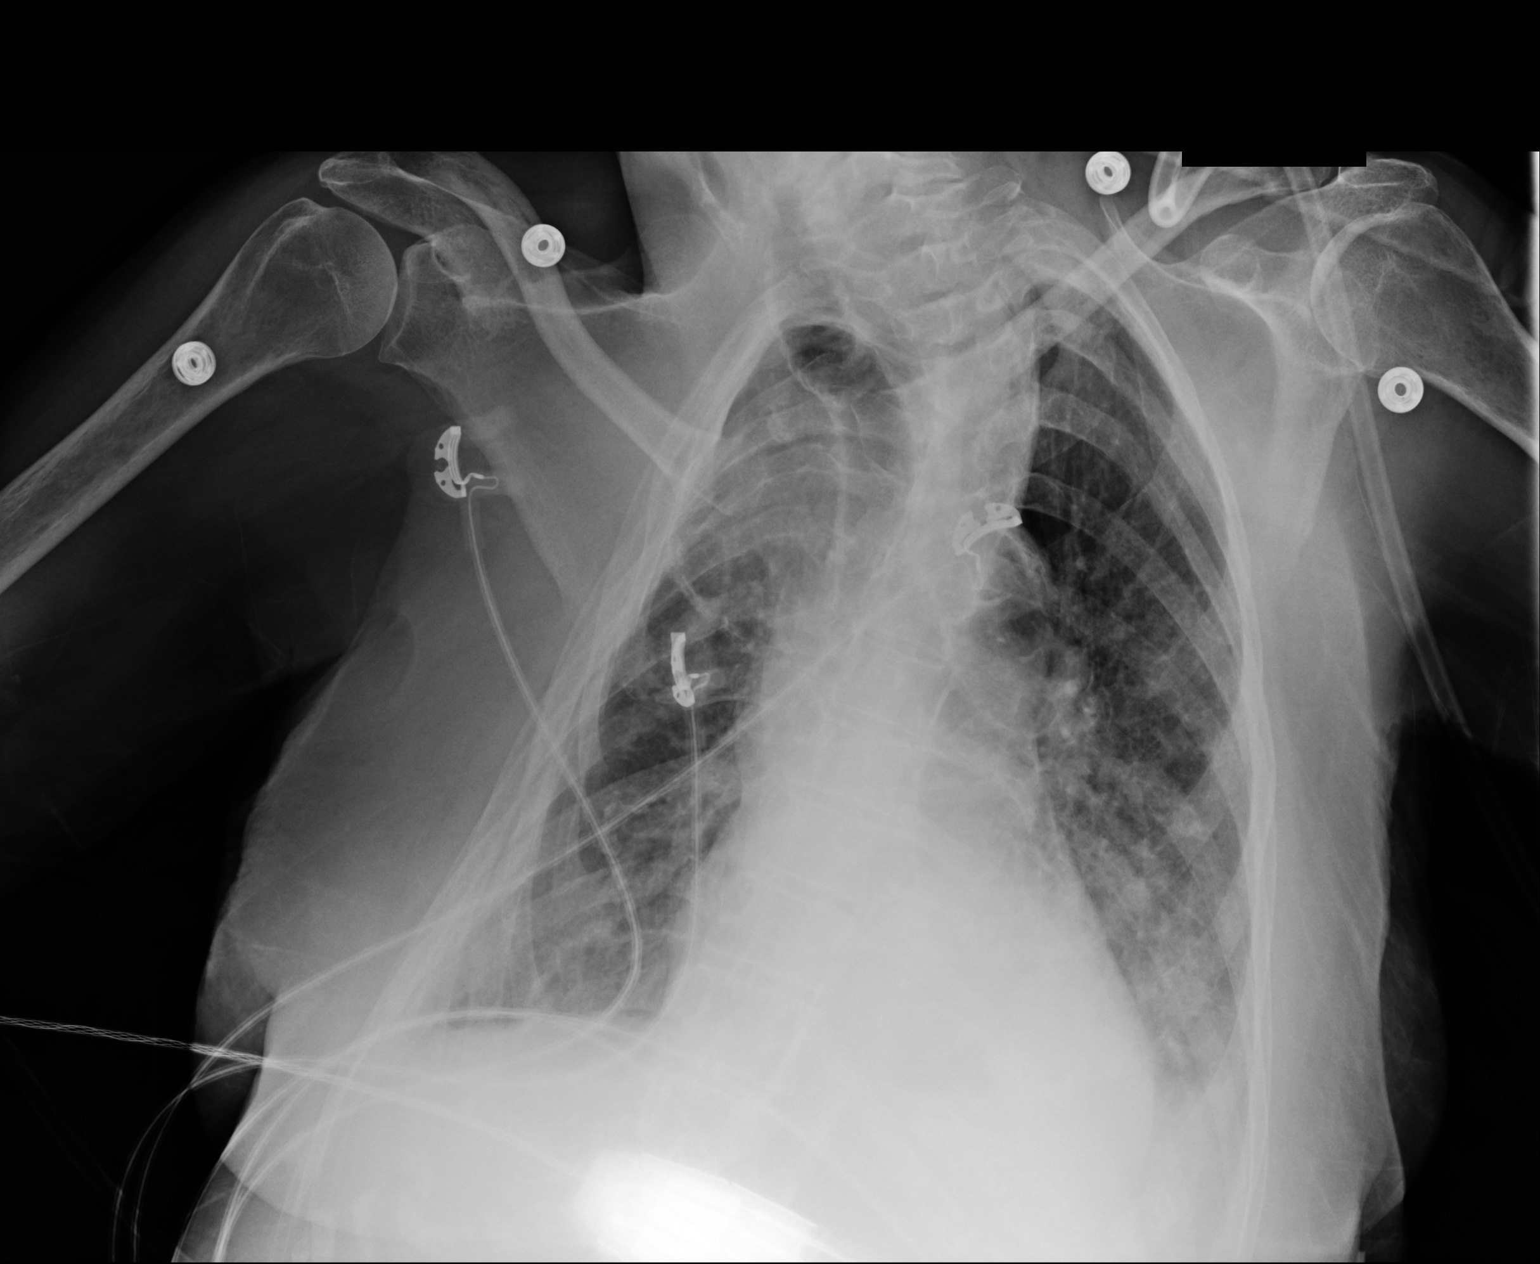

[1 of 1 positions shown; findings below may reference images not displayed]

FINDINGS: The patient is again rotated to the right. The cardiac silhouette is
mildly enlarged. Patchy left greater than right basilar airspace
opacities are unchanged. There may be a small persistent left
pleural effusion. No pneumothorax is identified.
IMPRESSION: Unchanged left greater than right basilar airspace disease and
possible small left pleural effusion.
